# Patient Record
Sex: Male | Born: 1965 | Race: White | Hispanic: No | Marital: Married | Smoking: Never smoker
Health system: Southern US, Community
[De-identification: ages and names within clinical notes are randomized; demographics above are authoritative.]

## PROBLEM LIST (undated history)

## (undated) DIAGNOSIS — K439 Ventral hernia without obstruction or gangrene: Secondary | ICD-10-CM

## (undated) DIAGNOSIS — L570 Actinic keratosis: Secondary | ICD-10-CM

## (undated) DIAGNOSIS — K219 Gastro-esophageal reflux disease without esophagitis: Secondary | ICD-10-CM

## (undated) DIAGNOSIS — D229 Melanocytic nevi, unspecified: Secondary | ICD-10-CM

## (undated) HISTORY — DX: Actinic keratosis: L57.0

## (undated) HISTORY — DX: Melanocytic nevi, unspecified: D22.9

---

## 1998-02-22 ENCOUNTER — Ambulatory Visit (HOSPITAL_COMMUNITY): Admission: RE | Admit: 1998-02-22 | Discharge: 1998-02-22 | Payer: Self-pay | Admitting: Gynecology

## 2009-04-05 ENCOUNTER — Encounter: Admission: RE | Admit: 2009-04-05 | Discharge: 2009-04-05 | Payer: Self-pay | Admitting: Chiropractic Medicine

## 2009-12-27 ENCOUNTER — Ambulatory Visit (HOSPITAL_COMMUNITY)
Admission: RE | Admit: 2009-12-27 | Discharge: 2009-12-27 | Payer: Self-pay | Source: Home / Self Care | Admitting: Orthopaedic Surgery

## 2012-05-20 DIAGNOSIS — H04229 Epiphora due to insufficient drainage, unspecified lacrimal gland: Secondary | ICD-10-CM | POA: Insufficient documentation

## 2012-08-03 ENCOUNTER — Other Ambulatory Visit: Payer: Self-pay | Admitting: Internal Medicine

## 2012-08-03 ENCOUNTER — Ambulatory Visit
Admission: RE | Admit: 2012-08-03 | Discharge: 2012-08-03 | Disposition: A | Discharge status: Home / Self Care | Payer: Commercial Managed Care - PPO | Source: Ambulatory Visit | Attending: Internal Medicine | Admitting: Internal Medicine

## 2012-08-03 DIAGNOSIS — R079 Chest pain, unspecified: Secondary | ICD-10-CM

## 2013-01-04 ENCOUNTER — Encounter: Payer: Self-pay | Admitting: Internal Medicine

## 2014-06-23 ENCOUNTER — Encounter (HOSPITAL_BASED_OUTPATIENT_CLINIC_OR_DEPARTMENT_OTHER): Payer: Self-pay | Admitting: *Deleted

## 2014-06-23 ENCOUNTER — Emergency Department (HOSPITAL_BASED_OUTPATIENT_CLINIC_OR_DEPARTMENT_OTHER)
Admission: EM | Admit: 2014-06-23 | Discharge: 2014-06-23 | Disposition: A | Discharge status: Home / Self Care | Payer: Commercial Managed Care - PPO | Attending: Emergency Medicine | Admitting: Emergency Medicine

## 2014-06-23 DIAGNOSIS — M6281 Muscle weakness (generalized): Secondary | ICD-10-CM | POA: Diagnosis not present

## 2014-06-23 DIAGNOSIS — R509 Fever, unspecified: Secondary | ICD-10-CM | POA: Insufficient documentation

## 2014-06-23 DIAGNOSIS — R112 Nausea with vomiting, unspecified: Secondary | ICD-10-CM | POA: Diagnosis not present

## 2014-06-23 DIAGNOSIS — Z880 Allergy status to penicillin: Secondary | ICD-10-CM | POA: Diagnosis not present

## 2014-06-23 DIAGNOSIS — Z8719 Personal history of other diseases of the digestive system: Secondary | ICD-10-CM | POA: Diagnosis not present

## 2014-06-23 HISTORY — DX: Ventral hernia without obstruction or gangrene: K43.9

## 2014-06-23 HISTORY — DX: Gastro-esophageal reflux disease without esophagitis: K21.9

## 2014-06-23 LAB — CBC WITH DIFFERENTIAL/PLATELET
BASOS ABS: 0 10*3/uL (ref 0.0–0.1)
BASOS PCT: 0 % (ref 0–1)
EOS ABS: 0 10*3/uL (ref 0.0–0.7)
EOS PCT: 0 % (ref 0–5)
HCT: 46.8 % (ref 39.0–52.0)
Hemoglobin: 15.9 g/dL (ref 13.0–17.0)
LYMPHS PCT: 5 % — AB (ref 12–46)
Lymphs Abs: 0.3 10*3/uL — ABNORMAL LOW (ref 0.7–4.0)
MCH: 30.7 pg (ref 26.0–34.0)
MCHC: 34 g/dL (ref 30.0–36.0)
MCV: 90.3 fL (ref 78.0–100.0)
MONO ABS: 0.4 10*3/uL (ref 0.1–1.0)
Monocytes Relative: 6 % (ref 3–12)
NEUTROS PCT: 89 % — AB (ref 43–77)
Neutro Abs: 5.9 10*3/uL (ref 1.7–7.7)
PLATELETS: 143 10*3/uL — AB (ref 150–400)
RBC: 5.18 MIL/uL (ref 4.22–5.81)
RDW: 13.2 % (ref 11.5–15.5)
WBC: 6.6 10*3/uL (ref 4.0–10.5)

## 2014-06-23 LAB — URINALYSIS, ROUTINE W REFLEX MICROSCOPIC
Bilirubin Urine: NEGATIVE
GLUCOSE, UA: NEGATIVE mg/dL
HGB URINE DIPSTICK: NEGATIVE
KETONES UR: 15 mg/dL — AB
LEUKOCYTES UA: NEGATIVE
NITRITE: NEGATIVE
PROTEIN: NEGATIVE mg/dL
SPECIFIC GRAVITY, URINE: 1.035 — AB (ref 1.005–1.030)
Urobilinogen, UA: 0.2 mg/dL (ref 0.0–1.0)
pH: 5.5 (ref 5.0–8.0)

## 2014-06-23 LAB — COMPREHENSIVE METABOLIC PANEL
ALT: 36 U/L (ref 17–63)
ANION GAP: 10 (ref 5–15)
AST: 35 U/L (ref 15–41)
Albumin: 4.3 g/dL (ref 3.5–5.0)
Alkaline Phosphatase: 47 U/L (ref 38–126)
BUN: 18 mg/dL (ref 6–20)
CHLORIDE: 105 mmol/L (ref 101–111)
CO2: 24 mmol/L (ref 22–32)
Calcium: 8.4 mg/dL — ABNORMAL LOW (ref 8.9–10.3)
Creatinine, Ser: 1.05 mg/dL (ref 0.61–1.24)
GFR calc Af Amer: >60 mL/min (ref >60)
GFR calc non Af Amer: >60 mL/min (ref >60)
GLUCOSE: 151 mg/dL — AB (ref 65–99)
Potassium: 3.6 mmol/L (ref 3.5–5.1)
Sodium: 139 mmol/L (ref 135–145)
TOTAL PROTEIN: 7.3 g/dL (ref 6.5–8.1)
Total Bilirubin: 1.2 mg/dL (ref 0.3–1.2)

## 2014-06-23 LAB — LIPASE, BLOOD: Lipase: 25 U/L (ref 22–51)

## 2014-06-23 MED ORDER — ONDANSETRON HCL 4 MG/2ML IJ SOLN
4.0000 mg | Freq: Once | INTRAMUSCULAR | Status: AC
  Administered 2014-06-23: 4 mg via INTRAVENOUS
  Filled 2014-06-23: qty 2

## 2014-06-23 MED ORDER — SODIUM CHLORIDE 0.9 % IV BOLUS (SEPSIS)
1000.0000 mL | Freq: Once | INTRAVENOUS | Status: AC
  Administered 2014-06-23: 1000 mL via INTRAVENOUS

## 2014-06-23 MED ORDER — ACETAMINOPHEN 325 MG PO TABS
975.0000 mg | ORAL_TABLET | Freq: Once | ORAL | Status: AC
  Administered 2014-06-23: 975 mg via ORAL
  Filled 2014-06-23: qty 3

## 2014-06-23 MED ORDER — ONDANSETRON HCL 4 MG PO TABS: 4.0000 mg | ORAL_TABLET | Freq: Four times a day (QID) | ORAL | Status: DC

## 2014-06-23 NOTE — ED Notes (Signed)
Pt here with wife from home, here for NV and low grade fever ("100"), also some weakness, nausea onset 0400 this am, emesis onset 0600, on and off all day, (denies: pain, cough, sob, diarrhea, bleeding, dizziness or other sx),  Recent trip to [place], Dr. Blinda LeatherwoodPollina EDP into room during triage. Last BM today. Emesis worse with PO intake and lying down.

## 2014-06-23 NOTE — Discharge Instructions (Signed)

## 2014-06-23 NOTE — ED Provider Notes (Signed)
CSN: [id number]     Arrival date & time 06/23/14  2026 History  This chart was scribed for Shane Crease, MD by Evon Slack, ED Scribe. This patient was seen in room MH03/MH03 and the patient's care was started at 8:37 PM.     Chief Complaint  Patient presents with  . Emesis   The history is provided by the patient. No language interpreter was used.   HPI Comments: Shane Woods is a 49 y.o. male who presents to the Emergency Department complaining of nausea and  vomiting onset today. Pt states he vomits about 1 hour after drinking. Pt dosnt report any medications PTA. Pt states that he has recently returned home from the [place]. Pt denies diarrhea, abdominal pain. Pt does report HX of GERD.   Past Medical History  Diagnosis Date  . Hernia of abdominal wall   . GERD (gastroesophageal reflux disease)    History reviewed. No pertinent past surgical history. History reviewed. No pertinent family history. History  Substance Use Topics  . Smoking status: Never Smoker   . Smokeless tobacco: Not on file  . Alcohol Use: Yes     Comment: occasional    Review of Systems  Constitutional: Negative for fever.  Gastrointestinal: Positive for nausea and vomiting. Negative for abdominal pain and diarrhea.  All other systems reviewed and are negative.    Allergies  Penicillins  Home Medications   Prior to Admission medications   Medication Sig Start Date End Date Taking? Authorizing Provider  ondansetron (ZOFRAN) 4 MG tablet Take 1 tablet (4 mg total) by mouth every 6 (six) hours. 06/23/14   Shane Crease, MD   BP 124/65 mmHg  Pulse 96  Temp(Src) 102.7 F (39.3 C) (Oral)  Resp 22  Ht  (1.702 m)  Wt 180 lb (81.647 kg)  BMI 28.19 kg/m2  SpO2 96%   Physical Exam  Constitutional: He is oriented to person, place, and time. He appears well-developed and well-nourished. No distress.  HENT:  Head: Normocephalic and atraumatic.  Right Ear: Hearing normal.  Left  Ear: Hearing normal.  Nose: Nose normal.  Mouth/Throat: Oropharynx is clear and moist and mucous membranes are normal.  Eyes: Conjunctivae and EOM are normal. Pupils are equal, round, and reactive to light.  Neck: Normal range of motion. Neck supple.  Cardiovascular: Regular rhythm, S1 normal and S2 normal.  Exam reveals no gallop and no friction rub.   No murmur heard. Pulmonary/Chest: Effort normal and breath sounds normal. No respiratory distress. He exhibits no tenderness.  Abdominal: Soft. Normal appearance and bowel sounds are normal. There is no hepatosplenomegaly. There is no tenderness. There is no rebound, no guarding, no tenderness at McBurney's point and negative Murphy's sign. No hernia.  Musculoskeletal: Normal range of motion.  Neurological: He is alert and oriented to person, place, and time. He has normal strength. No cranial nerve deficit or sensory deficit. Coordination normal. GCS eye subscore is 4. GCS verbal subscore is 5. GCS motor subscore is 6.  Skin: Skin is warm, dry and intact. No rash noted. No cyanosis.  Psychiatric: He has a normal mood and affect. His speech is normal and behavior is normal. Thought content normal.  Nursing note and vitals reviewed.   ED Course  Procedures (including critical care time) DIAGNOSTIC STUDIES: Oxygen Saturation is 96% on ra, nml by my interpretation.    COORDINATION OF CARE: 8:51 PM-Discussed treatment plan with pt at bedside and pt agreed to plan.  Labs Review Labs Reviewed  CBC WITH DIFFERENTIAL/PLATELET - Abnormal; Notable for the following:    Platelets 143 (*)    Neutrophils Relative % 89 (*)    Lymphocytes Relative 5 (*)    Lymphs Abs 0.3 (*)    All other components within normal limits  COMPREHENSIVE METABOLIC PANEL - Abnormal; Notable for the following:    Glucose, Bld 151 (*)    Calcium 8.4 (*)    All other components within normal limits  URINALYSIS, ROUTINE W REFLEX MICROSCOPIC (NOT AT [facility]) - Abnormal;  Notable for the following:    Specific Gravity, Urine 1.035 (*)    Ketones, ur 15 (*)    All other components within normal limits  LIPASE, BLOOD    Imaging Review No results found.   EKG Interpretation None      MDM   Final diagnoses:  Nausea and vomiting, vomiting of unspecified type      Patient presented to the ER for evaluation of generalized weakness with nausea and vomiting. Patient awoke early this morning with nausea and then this morning began having vomiting. He has not been able to hold anything down. He reports a fever at home. He is not, however, expressing any abdominal pain. Abdominal exam is benign. Deep palpation does not elicit any pain or tenderness. Patient's vital signs are unremarkable other than a temperature of 102.7. Blood work is normal including normal white blood cell count. Urinalysis unremarkable. Patient feeling much better after IV fluids and Zofran. Symptoms most likely secondary to viral etiology. Patient will be discharged with symptomatic treatment, return for worsening symptoms.     Shane Creasehristopher J Pollina, MD 06/23/14 2211

## 2014-10-26 IMAGING — RF DG UGI W/ HIGH DENSITY W/KUB
11 of 12 series · 18 of 24 positions shown · non-contrast
Comparison: None.

CLINICAL DATA: Chest pain, possible gastroesophageal reflux

UPPER GI SERIES W/HIGH DENSITY W/KUB
TECHNIQUE: After obtaining a scout radiograph, upper GI series
performed with high density barium and effervescent agent. Thin
barium also used.
Fluoroscopy Time: 1 minute 12 seconds

[Series 1: run · 1 of 6 slices shown (1 of 10)]
[im 1/6]
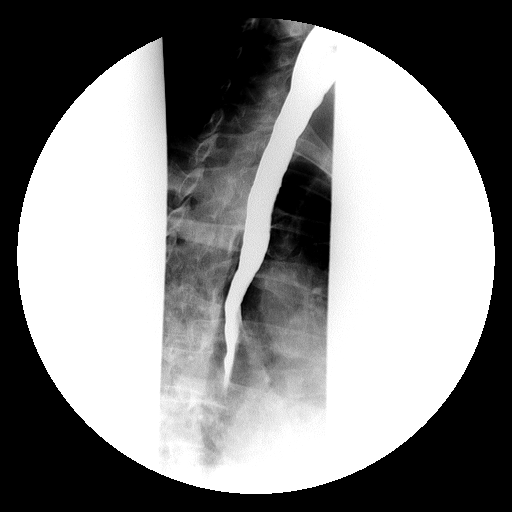

[Series 2: run · 3 of 9 slices shown (2 of 10)]
[im 1/9]
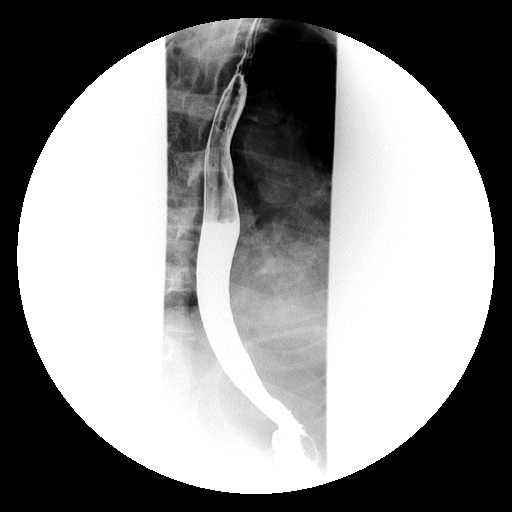
[im 3/9]
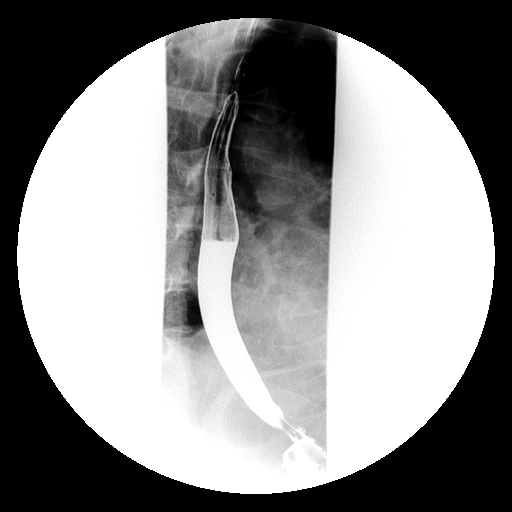
[im 6/9]
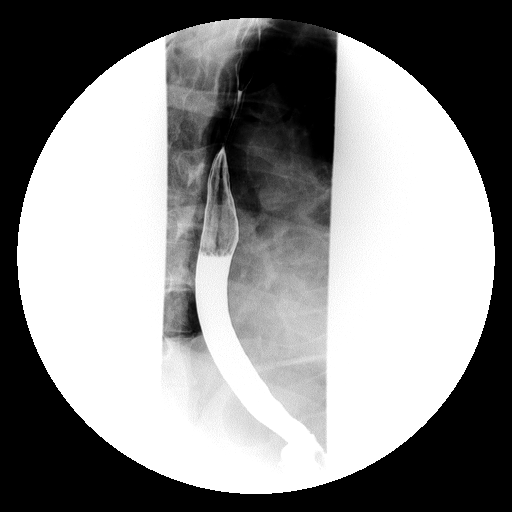

[Series 3: run · 1 of 1 slices shown (3 of 10)]
[im 1/1]
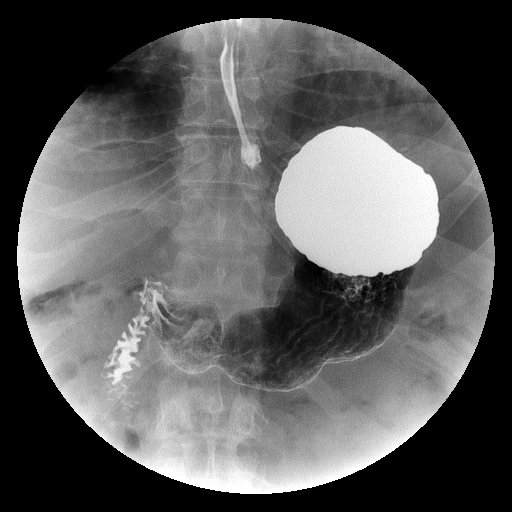

[Series 4: run · 1 of 1 slices shown (4 of 10)]
[im 1/1]
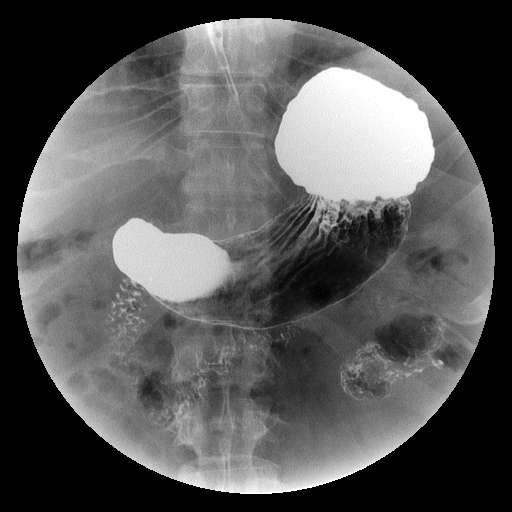

[Series 5: run · 1 of 1 slices shown (5 of 10)]
[im 1/1]
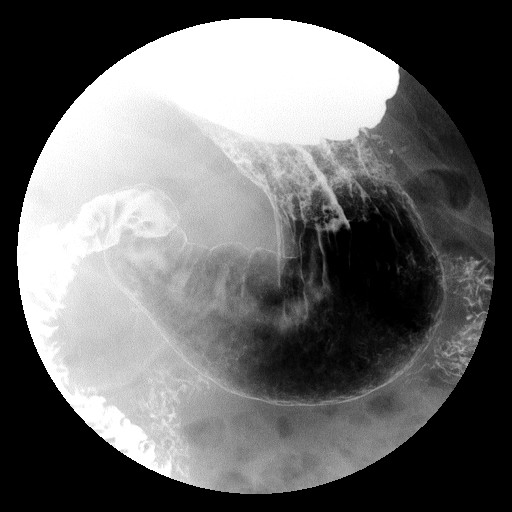

[Series 7: run · 1 of 1 slices shown (6 of 10)]
[im 1/1]
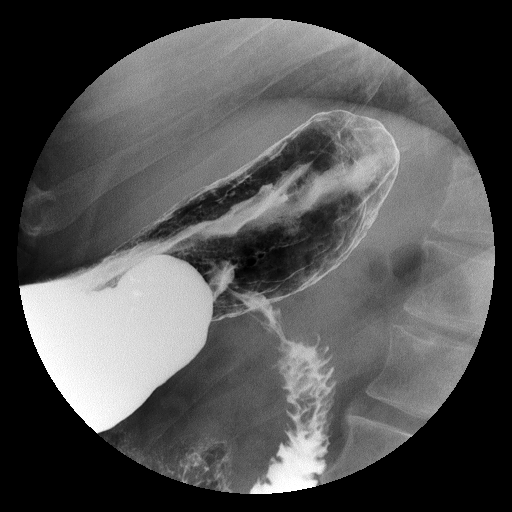

[Series 8: run · 1 of 1 slices shown (7 of 10)]
[im 1/1]
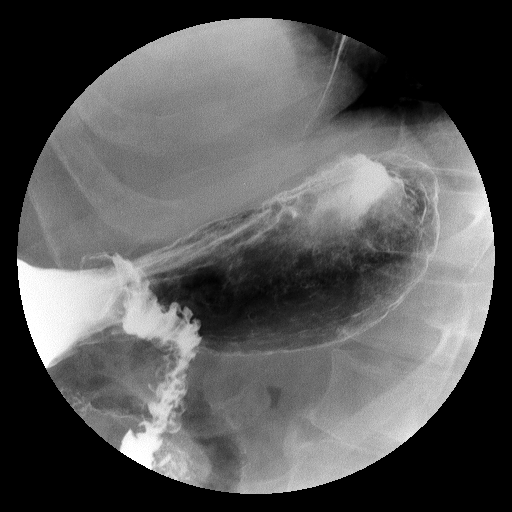

[Series 9: run · 1 of 1 slices shown (8 of 10)]
[im 1/1]
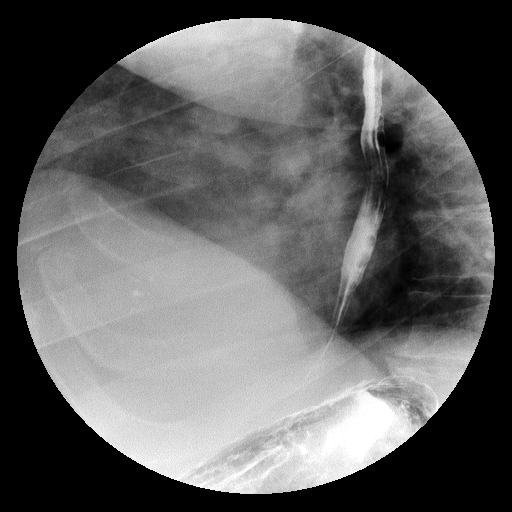

[Series 10: run · 3 of 8 slices shown (9 of 10)]
[im 3/8]
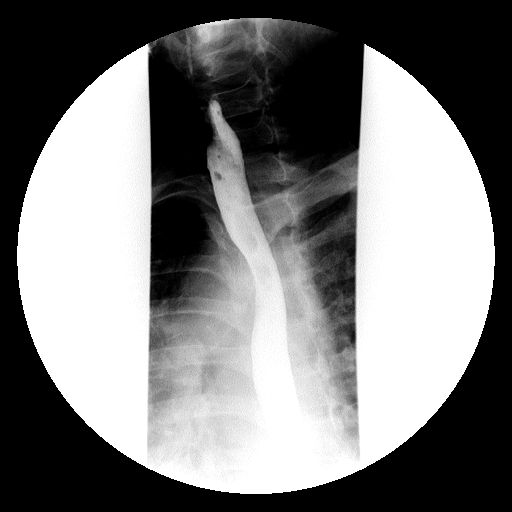
[im 5/8]
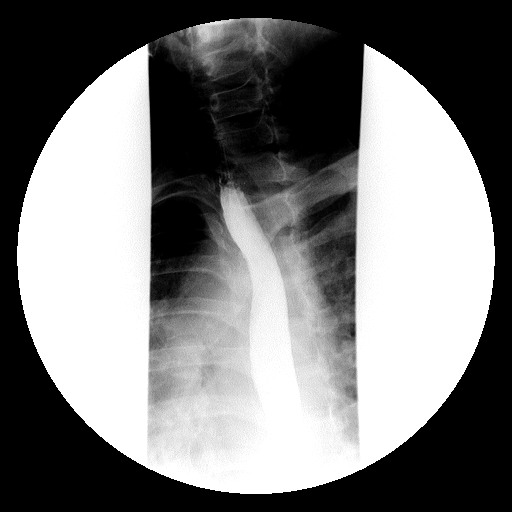
[im 8/8]
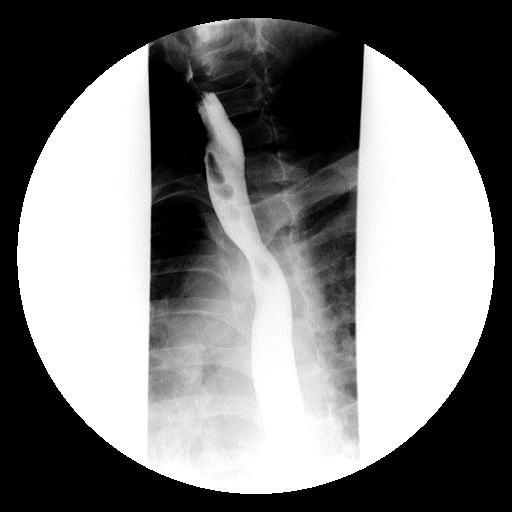

[Series 11: run · 4 of 11 slices shown (10 of 10)]
[im 3/11]
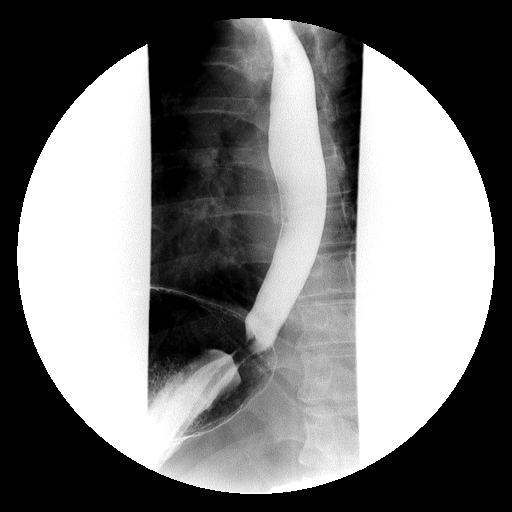
[im 5/11]
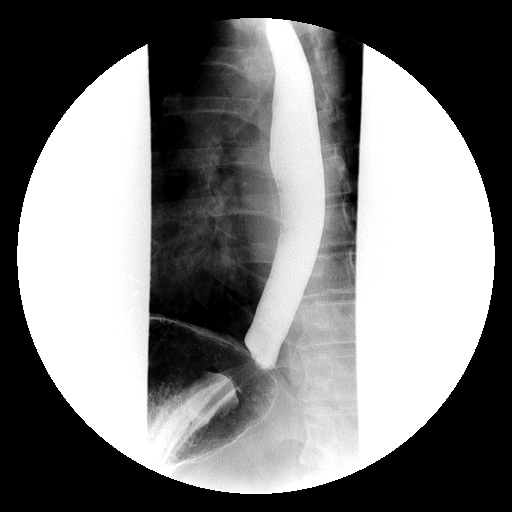
[im 7/11]
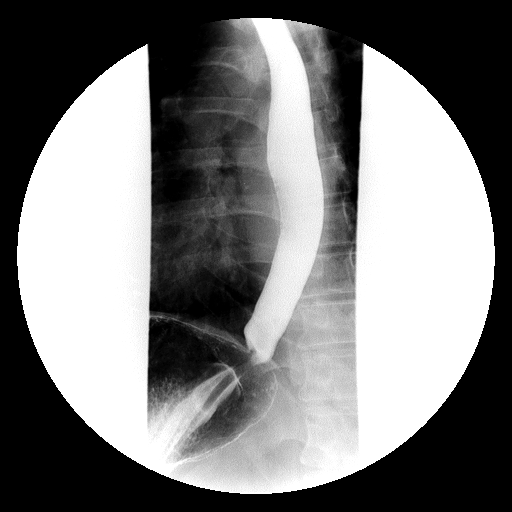
[im 11/11]
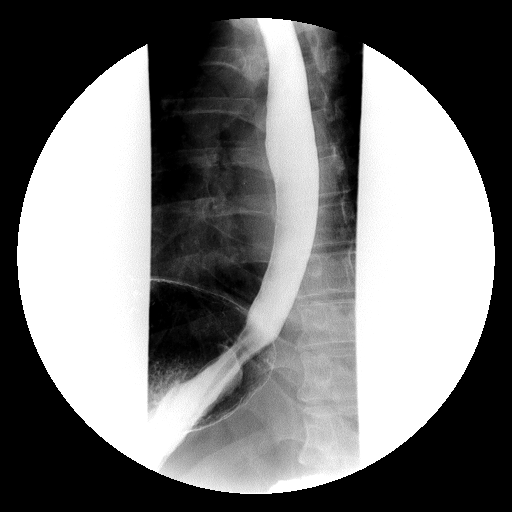

[Series 1001: view not recorded · 0.20mm/px · 1 of 1 slices shown]
[im 1/1]
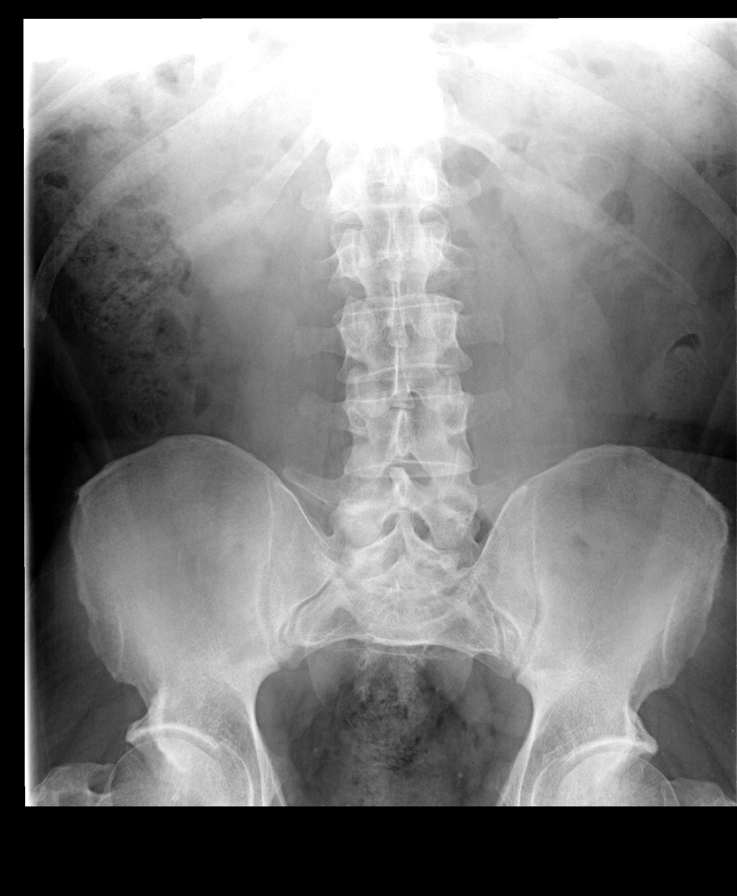

[18 of 24 positions shown; findings below may reference images not displayed]

FINDINGS: Normal esophageal peristalsis.

No fixed esophageal narrowing or stricture.  A 13 mm barium tablet
passed into the stomach without delay.

No hiatal hernia.  Moderate gastroesophageal reflux.

Normal gastric folds and duodenal bulb.
IMPRESSION: Moderate gastroesophageal reflux.

Otherwise negative upper GI.

## 2017-02-22 ENCOUNTER — Other Ambulatory Visit (HOSPITAL_COMMUNITY): Payer: Commercial Managed Care - PPO

## 2017-02-22 ENCOUNTER — Other Ambulatory Visit: Payer: Self-pay | Admitting: Internal Medicine

## 2017-02-22 DIAGNOSIS — R9431 Abnormal electrocardiogram [ECG] [EKG]: Secondary | ICD-10-CM

## 2017-02-22 DIAGNOSIS — R0789 Other chest pain: Secondary | ICD-10-CM

## 2017-02-23 ENCOUNTER — Ambulatory Visit (HOSPITAL_COMMUNITY): Payer: Commercial Managed Care - PPO | Attending: Cardiovascular Disease

## 2017-02-23 ENCOUNTER — Other Ambulatory Visit: Payer: Self-pay

## 2017-02-23 DIAGNOSIS — Z8249 Family history of ischemic heart disease and other diseases of the circulatory system: Secondary | ICD-10-CM | POA: Insufficient documentation

## 2017-02-23 DIAGNOSIS — R0789 Other chest pain: Secondary | ICD-10-CM

## 2017-02-23 DIAGNOSIS — R9431 Abnormal electrocardiogram [ECG] [EKG]: Secondary | ICD-10-CM | POA: Diagnosis not present

## 2017-11-18 DIAGNOSIS — Z23 Encounter for immunization: Secondary | ICD-10-CM | POA: Diagnosis not present

## 2018-02-25 DIAGNOSIS — Z0000 Encounter for general adult medical examination without abnormal findings: Secondary | ICD-10-CM | POA: Diagnosis not present

## 2018-02-25 DIAGNOSIS — E7849 Other hyperlipidemia: Secondary | ICD-10-CM | POA: Diagnosis not present

## 2018-02-25 DIAGNOSIS — Z125 Encounter for screening for malignant neoplasm of prostate: Secondary | ICD-10-CM | POA: Diagnosis not present

## 2018-03-04 DIAGNOSIS — K219 Gastro-esophageal reflux disease without esophagitis: Secondary | ICD-10-CM | POA: Diagnosis not present

## 2018-03-04 DIAGNOSIS — R9431 Abnormal electrocardiogram [ECG] [EKG]: Secondary | ICD-10-CM | POA: Diagnosis not present

## 2018-03-04 DIAGNOSIS — I8390 Asymptomatic varicose veins of unspecified lower extremity: Secondary | ICD-10-CM | POA: Diagnosis not present

## 2018-03-04 DIAGNOSIS — E7849 Other hyperlipidemia: Secondary | ICD-10-CM | POA: Diagnosis not present

## 2018-03-04 DIAGNOSIS — Z1331 Encounter for screening for depression: Secondary | ICD-10-CM | POA: Diagnosis not present

## 2018-03-04 DIAGNOSIS — Z0000 Encounter for general adult medical examination without abnormal findings: Secondary | ICD-10-CM | POA: Diagnosis not present

## 2018-03-09 DIAGNOSIS — Z1212 Encounter for screening for malignant neoplasm of rectum: Secondary | ICD-10-CM | POA: Diagnosis not present

## 2018-03-16 DIAGNOSIS — J029 Acute pharyngitis, unspecified: Secondary | ICD-10-CM | POA: Diagnosis not present

## 2018-03-17 DIAGNOSIS — Z6830 Body mass index (BMI) 30.0-30.9, adult: Secondary | ICD-10-CM | POA: Diagnosis not present

## 2018-03-17 DIAGNOSIS — J029 Acute pharyngitis, unspecified: Secondary | ICD-10-CM | POA: Diagnosis not present

## 2018-03-17 DIAGNOSIS — B029 Zoster without complications: Secondary | ICD-10-CM | POA: Diagnosis not present

## 2018-03-22 DIAGNOSIS — L821 Other seborrheic keratosis: Secondary | ICD-10-CM | POA: Diagnosis not present

## 2018-03-22 DIAGNOSIS — L820 Inflamed seborrheic keratosis: Secondary | ICD-10-CM | POA: Diagnosis not present

## 2018-03-22 DIAGNOSIS — D229 Melanocytic nevi, unspecified: Secondary | ICD-10-CM | POA: Diagnosis not present

## 2018-03-22 DIAGNOSIS — D2271 Melanocytic nevi of right lower limb, including hip: Secondary | ICD-10-CM | POA: Diagnosis not present

## 2018-06-03 DIAGNOSIS — E7849 Other hyperlipidemia: Secondary | ICD-10-CM | POA: Diagnosis not present

## 2018-06-06 DIAGNOSIS — E785 Hyperlipidemia, unspecified: Secondary | ICD-10-CM | POA: Diagnosis not present

## 2018-06-06 DIAGNOSIS — B029 Zoster without complications: Secondary | ICD-10-CM | POA: Diagnosis not present

## 2018-06-06 DIAGNOSIS — J309 Allergic rhinitis, unspecified: Secondary | ICD-10-CM | POA: Diagnosis not present

## 2018-08-11 DIAGNOSIS — D485 Neoplasm of uncertain behavior of skin: Secondary | ICD-10-CM | POA: Diagnosis not present

## 2018-08-11 DIAGNOSIS — L249 Irritant contact dermatitis, unspecified cause: Secondary | ICD-10-CM | POA: Diagnosis not present

## 2018-08-11 DIAGNOSIS — B078 Other viral warts: Secondary | ICD-10-CM | POA: Diagnosis not present

## 2018-08-11 DIAGNOSIS — S70361A Insect bite (nonvenomous), right thigh, initial encounter: Secondary | ICD-10-CM | POA: Diagnosis not present

## 2018-11-24 DIAGNOSIS — Z23 Encounter for immunization: Secondary | ICD-10-CM | POA: Diagnosis not present

## 2019-01-16 DIAGNOSIS — H16141 Punctate keratitis, right eye: Secondary | ICD-10-CM | POA: Diagnosis not present

## 2019-03-02 DIAGNOSIS — Z125 Encounter for screening for malignant neoplasm of prostate: Secondary | ICD-10-CM | POA: Diagnosis not present

## 2019-03-02 DIAGNOSIS — R82998 Other abnormal findings in urine: Secondary | ICD-10-CM | POA: Diagnosis not present

## 2019-03-02 DIAGNOSIS — E7849 Other hyperlipidemia: Secondary | ICD-10-CM | POA: Diagnosis not present

## 2019-03-02 DIAGNOSIS — Z0000 Encounter for general adult medical examination without abnormal findings: Secondary | ICD-10-CM | POA: Diagnosis not present

## 2019-03-09 DIAGNOSIS — B029 Zoster without complications: Secondary | ICD-10-CM | POA: Diagnosis not present

## 2019-03-09 DIAGNOSIS — Z1331 Encounter for screening for depression: Secondary | ICD-10-CM | POA: Diagnosis not present

## 2019-03-09 DIAGNOSIS — E785 Hyperlipidemia, unspecified: Secondary | ICD-10-CM | POA: Diagnosis not present

## 2019-03-09 DIAGNOSIS — R9431 Abnormal electrocardiogram [ECG] [EKG]: Secondary | ICD-10-CM | POA: Diagnosis not present

## 2019-03-09 DIAGNOSIS — Z0000 Encounter for general adult medical examination without abnormal findings: Secondary | ICD-10-CM | POA: Diagnosis not present

## 2019-03-09 DIAGNOSIS — K219 Gastro-esophageal reflux disease without esophagitis: Secondary | ICD-10-CM | POA: Diagnosis not present

## 2019-03-15 DIAGNOSIS — Z1212 Encounter for screening for malignant neoplasm of rectum: Secondary | ICD-10-CM | POA: Diagnosis not present

## 2019-03-31 DIAGNOSIS — H43813 Vitreous degeneration, bilateral: Secondary | ICD-10-CM | POA: Diagnosis not present

## 2019-04-06 DIAGNOSIS — Z86018 Personal history of other benign neoplasm: Secondary | ICD-10-CM | POA: Diagnosis not present

## 2019-04-06 DIAGNOSIS — L570 Actinic keratosis: Secondary | ICD-10-CM | POA: Diagnosis not present

## 2019-04-06 DIAGNOSIS — D2271 Melanocytic nevi of right lower limb, including hip: Secondary | ICD-10-CM | POA: Diagnosis not present

## 2019-04-06 DIAGNOSIS — D1801 Hemangioma of skin and subcutaneous tissue: Secondary | ICD-10-CM | POA: Diagnosis not present

## 2019-04-06 DIAGNOSIS — L814 Other melanin hyperpigmentation: Secondary | ICD-10-CM | POA: Diagnosis not present

## 2019-11-28 DIAGNOSIS — Z23 Encounter for immunization: Secondary | ICD-10-CM | POA: Diagnosis not present

## 2019-12-30 DIAGNOSIS — S139XXA Sprain of joints and ligaments of unspecified parts of neck, initial encounter: Secondary | ICD-10-CM | POA: Diagnosis not present

## 2020-04-08 DIAGNOSIS — L739 Follicular disorder, unspecified: Secondary | ICD-10-CM | POA: Diagnosis not present

## 2020-04-08 DIAGNOSIS — L814 Other melanin hyperpigmentation: Secondary | ICD-10-CM | POA: Diagnosis not present

## 2020-04-08 DIAGNOSIS — L570 Actinic keratosis: Secondary | ICD-10-CM | POA: Diagnosis not present

## 2020-04-08 DIAGNOSIS — Z86018 Personal history of other benign neoplasm: Secondary | ICD-10-CM | POA: Diagnosis not present

## 2020-04-23 DIAGNOSIS — E785 Hyperlipidemia, unspecified: Secondary | ICD-10-CM | POA: Diagnosis not present

## 2020-04-23 DIAGNOSIS — Z125 Encounter for screening for malignant neoplasm of prostate: Secondary | ICD-10-CM | POA: Diagnosis not present

## 2020-04-30 DIAGNOSIS — E785 Hyperlipidemia, unspecified: Secondary | ICD-10-CM | POA: Diagnosis not present

## 2020-04-30 DIAGNOSIS — Z0000 Encounter for general adult medical examination without abnormal findings: Secondary | ICD-10-CM | POA: Diagnosis not present

## 2020-08-21 DIAGNOSIS — R0981 Nasal congestion: Secondary | ICD-10-CM | POA: Diagnosis not present

## 2020-08-21 DIAGNOSIS — J029 Acute pharyngitis, unspecified: Secondary | ICD-10-CM | POA: Diagnosis not present

## 2020-08-21 DIAGNOSIS — R059 Cough, unspecified: Secondary | ICD-10-CM | POA: Diagnosis not present

## 2020-08-21 DIAGNOSIS — U071 COVID-19: Secondary | ICD-10-CM | POA: Diagnosis not present

## 2021-03-27 ENCOUNTER — Other Ambulatory Visit (HOSPITAL_COMMUNITY): Payer: Self-pay

## 2021-03-27 MED ORDER — PEG 3350-KCL-NA BICARB-NACL 420 G PO SOLR
ORAL | 0 refills | Status: AC
  Filled 2021-03-27: qty 4000, 1d supply, fill #0

## 2021-03-28 ENCOUNTER — Other Ambulatory Visit (HOSPITAL_COMMUNITY): Payer: Self-pay

## 2021-03-31 ENCOUNTER — Other Ambulatory Visit (HOSPITAL_COMMUNITY): Payer: Self-pay

## 2021-05-07 ENCOUNTER — Other Ambulatory Visit (HOSPITAL_BASED_OUTPATIENT_CLINIC_OR_DEPARTMENT_OTHER): Payer: Self-pay

## 2021-05-07 MED ORDER — ROSUVASTATIN CALCIUM 5 MG PO TABS
ORAL_TABLET | ORAL | 3 refills | Status: DC
  Filled 2021-05-07 – 2021-05-13 (×2): qty 90, 90d supply, fill #0
  Filled 2021-10-07: qty 90, 90d supply, fill #1

## 2021-05-07 MED ORDER — FLUTICASONE PROPIONATE 50 MCG/ACT NA SUSP
NASAL | 3 refills | Status: AC
  Filled 2021-05-07: qty 16, 30d supply, fill #0
  Filled 2021-05-13: qty 16, 60d supply, fill #0

## 2021-05-12 ENCOUNTER — Other Ambulatory Visit (HOSPITAL_COMMUNITY): Payer: Self-pay

## 2021-05-12 ENCOUNTER — Other Ambulatory Visit: Payer: Self-pay

## 2021-05-13 ENCOUNTER — Other Ambulatory Visit (HOSPITAL_BASED_OUTPATIENT_CLINIC_OR_DEPARTMENT_OTHER): Payer: Self-pay

## 2021-05-13 ENCOUNTER — Other Ambulatory Visit (HOSPITAL_COMMUNITY): Payer: Self-pay

## 2021-06-20 ENCOUNTER — Other Ambulatory Visit (HOSPITAL_COMMUNITY): Payer: Self-pay

## 2021-06-20 MED ORDER — OMRON 3 SERIES BP MONITOR DEVI
0 refills | Status: AC
  Filled 2021-06-20: qty 1, 90d supply, fill #0

## 2021-10-07 ENCOUNTER — Other Ambulatory Visit (HOSPITAL_COMMUNITY): Payer: Self-pay

## 2021-11-03 ENCOUNTER — Other Ambulatory Visit (HOSPITAL_COMMUNITY): Payer: Self-pay

## 2021-11-03 MED ORDER — DOXYCYCLINE HYCLATE 100 MG PO CAPS
100.0000 mg | ORAL_CAPSULE | Freq: Two times a day (BID) | ORAL | 0 refills | Status: DC
  Filled 2021-11-03: qty 20, 10d supply, fill #0

## 2021-12-03 ENCOUNTER — Other Ambulatory Visit: Payer: Self-pay | Admitting: Internal Medicine

## 2021-12-03 DIAGNOSIS — R0789 Other chest pain: Secondary | ICD-10-CM

## 2022-01-12 ENCOUNTER — Ambulatory Visit
Admission: RE | Admit: 2022-01-12 | Discharge: 2022-01-12 | Disposition: A | Discharge status: Home / Self Care | Payer: 59 | Source: Ambulatory Visit | Attending: Internal Medicine | Admitting: Internal Medicine

## 2022-01-12 DIAGNOSIS — R0789 Other chest pain: Secondary | ICD-10-CM

## 2022-02-04 ENCOUNTER — Other Ambulatory Visit (HOSPITAL_COMMUNITY): Payer: Self-pay

## 2022-02-04 ENCOUNTER — Other Ambulatory Visit: Payer: Self-pay

## 2022-02-04 MED ORDER — PAXLOVID (300/100) 20 X 150 MG & 10 X 100MG PO TBPK
3.0000 | ORAL_TABLET | Freq: Two times a day (BID) | ORAL | 0 refills | Status: DC
  Filled 2022-02-04 – 2022-02-05 (×2): qty 30, 5d supply, fill #0

## 2022-02-05 ENCOUNTER — Other Ambulatory Visit (HOSPITAL_COMMUNITY): Payer: Self-pay

## 2022-02-05 MED ORDER — PAXLOVID (300/100) 20 X 150 MG & 10 X 100MG PO TBPK
ORAL_TABLET | ORAL | 0 refills | Status: DC
  Filled 2022-02-05: qty 30, 5d supply, fill #0

## 2022-02-16 NOTE — Progress Notes (Signed)
Cardiology Office Note:    Date:  02/23/2022   ID:  Shane Woods, DOB Apr 11, 1965, MRN [id number]  PCP:  Prince Solian, MD   [place] Providers Cardiologist:  None   Referring MD: Prince Solian, MD    History of Present Illness:    Shane Woods is a 57 y.o. male with a hx of GERD who was referred by Dr. Dagmar Hait for further evaluation of coronary calcification.   Patient underwent Ca score on 12/18 which revealed Ca score 390 prompting referral to Cardiology for further evaluation.  Today, the patient overall feels well. No chest pain, SOB, lightheadedness, dizziness, LE edema, orthopnea, and PND. No tobacco use or alcohol. Discussed his Ca score at length today.   Father with history of CAD with history of MI.   Past Medical History:  Diagnosis Date   GERD (gastroesophageal reflux disease)    Hernia of abdominal wall     History reviewed. No pertinent surgical history.  Current Medications: Current Meds  Medication Sig   Blood Pressure Monitoring (OMRON 3 SERIES BP MONITOR) DEVI Use as directed   doxycycline (VIBRAMYCIN) 100 MG capsule Take 1 capsule (100 mg total) by mouth 2 (two) times daily as directed   fluticasone (FLONASE ALLERGY RELIEF) 50 MCG/ACT nasal spray Place 1 spray in each nostril Once a day   polyethylene glycol-electrolytes (NULYTELY) 420 g solution Use as directed.   rosuvastatin (CRESTOR) 5 MG tablet Take 1 tablet by mouth daily     Allergies:   Penicillins   Social History   Socioeconomic History   Marital status: Married    Spouse name: Not on file   Number of children: Not on file   Years of education: Not on file   Highest education level: Not on file  Occupational History   Not on file  Tobacco Use   Smoking status: Never   Smokeless tobacco: Never  Substance and Sexual Activity   Alcohol use: Never    Comment: occasional   Drug use: No   Sexual activity: Not on file  Other Topics Concern   Not on file  Social  History Narrative   Not on file   Social Determinants of Health   Financial Resource Strain: Not on file  Food Insecurity: Not on file  Transportation Needs: Not on file  Physical Activity: Not on file  Stress: Not on file  Social Connections: Not on file     Family History: The patient's family history includes Heart attack in his father.  ROS:   Please see the history of present illness.     All other systems reviewed and are negative.  EKGs/Labs/Other Studies Reviewed:    The following studies were reviewed today: Ca Score 12/2021:  IMPRESSION: 1. LAD and RIGHT coronary artery calcification.   2. Total Agatston Score: 390   3. MESA age, race  and sex matched database percentile: 36th   4. Two pulmonary nodules. Most significant: 4 mm left solid pulmonary nodule. Per Fleischner Society Guidelines, no routine follow-up imaging is recommended.  TTE 01/2017: Study Conclusions   - Left ventricle: The cavity size was normal. Systolic function was    normal. The estimated ejection fraction was in the range of 55%    to 60%. Wall motion was normal; there were no regional wall    motion abnormalities. Left ventricular diastolic function    parameters were normal.  - Left atrium: The atrium was moderately dilated.  - Atrial septum: No  defect or patent foramen ovale was identified.   EKG:  EKG is  ordered today.  The ekg ordered today demonstrates NSR, LVH with strain, HR 69  Recent Labs: No results found for requested labs within last 365 days.  Recent Lipid Panel No results found for: "CHOL", "TRIG", "HDL", "CHOLHDL", "VLDL", "LDLCALC", "LDLDIRECT"   Risk Assessment/Calculations:                Physical Exam:    VS:  BP 138/88   Pulse 68   Ht 5\' 7"  (1.702 m)   Wt 195 lb 3.2 oz (88.5 kg)   SpO2 97%   BMI 30.57 kg/m     Wt Readings from Last 3 Encounters:  02/23/22 195 lb 3.2 oz (88.5 kg)  06/23/14 180 lb (81.6 kg)     GEN:  Well nourished, well  developed in no acute distress HEENT: Normal NECK: No JVD; No carotid bruits CARDIAC: RRR, no murmurs, rubs, gallops RESPIRATORY:  Clear to auscultation without rales, wheezing or rhonchi  ABDOMEN: Soft, non-tender, non-distended MUSCULOSKELETAL:  No edema; No deformity  SKIN: Warm and dry NEUROLOGIC:  Alert and oriented x 3 PSYCHIATRIC:  Normal affect   ASSESSMENT:    1. Coronary artery disease involving native coronary artery of native heart without angina pectoris   2. Medication management   3. Hyperlipidemia, unspecified hyperlipidemia type   4. LVH (left ventricular hypertrophy)    PLAN:    In order of problems listed above:  #Coronary Ca: Ca score 390 which was 93% for age, gender, race matched controls. Currently doing well without anginal symptoms. Will risk stratify with NMR, Lp(a), apolipoprotein B given his degree of calcification is out of proportion to his LDL cholesterol.  -Continue crestor 5mg  daily -Check NMR, Lp(a), apolipoprotein B  #Abnormal ECG: ECG suggestive of LVH with strain. BP 130s today and he will start monitoring at home. Will check TTE to assess further. -Check TTE          Medication Adjustments/Labs and Tests Ordered: Current medicines are reviewed at length with the patient today.  Concerns regarding medicines are outlined above.  Orders Placed This Encounter  Procedures   NMR, lipoprofile   Lipoprotein A (LPA)   Apolipoprotein B   EKG 12-Lead   ECHOCARDIOGRAM COMPLETE   No orders of the defined types were placed in this encounter.   Patient Instructions  Medication Instructions:   Your physician recommends that you continue on your current medications as directed. Please refer to the Current Medication list given to you today.  *If you need a refill on your cardiac medications before your next appointment, please call your pharmacy*   Lab Work:  TODAY--NMR LIPOPROFILE, LIPOPROTEIN A, AND APOLIPOPROTEIN B  If you have labs  (blood work) drawn today and your tests are completely normal, you will receive your results only by: MyChart Message (if you have MyChart) OR A paper copy in the mail If you have any lab test that is abnormal or we need to change your treatment, we will call you to review the results.   Testing/Procedures:  Your physician has requested that you have an echocardiogram. Echocardiography is a painless test that uses sound waves to create images of your heart. It provides your doctor with information about the size and shape of your heart and how well your heart's chambers and valves are working. This procedure takes approximately one hour. There are no restrictions for this procedure. Please do NOT wear cologne, perfume, aftershave,  or lotions (deodorant is allowed). Please arrive 15 minutes prior to your appointment time.    Follow-Up: At [facility], you and your health needs are our priority.  As part of our continuing mission to provide you with exceptional heart care, we have created designated Provider Care Teams.  These Care Teams include your primary Cardiologist (physician) and Advanced Practice Providers (APPs -  Physician Assistants and Nurse Practitioners) who all work together to provide you with the care you need, when you need it.  We recommend signing up for the patient portal called "MyChart".  Sign up information is provided on this After Visit Summary.  MyChart is used to connect with patients for Virtual Visits (Telemedicine).  Patients are able to view lab/test results, encounter notes, upcoming appointments, etc.  Non-urgent messages can be sent to your provider as well.   To learn more about what you can do with MyChart, go to [email or web address].    Your next appointment:   6 month(s)  Provider:   Nicholes Rough, PA-C, Melina Copa, PA-C, Ambrose Pancoast, NP, Cecilie Kicks, NP, Ermalinda Barrios, PA-C, Christen Bame, NP, or Richardson Dopp, PA-C              Signed, Freada Bergeron, MD  02/23/2022 4:45 PM    [place]

## 2022-02-23 ENCOUNTER — Ambulatory Visit: Payer: Commercial Managed Care - PPO | Attending: Cardiology | Admitting: Cardiology

## 2022-02-23 ENCOUNTER — Encounter: Payer: Self-pay | Admitting: Cardiology

## 2022-02-23 VITALS — BP 138/88 | HR 68 | Ht 67.0 in | Wt 195.2 lb

## 2022-02-23 DIAGNOSIS — Z79899 Other long term (current) drug therapy: Secondary | ICD-10-CM | POA: Diagnosis not present

## 2022-02-23 DIAGNOSIS — I517 Cardiomegaly: Secondary | ICD-10-CM

## 2022-02-23 DIAGNOSIS — I2510 Atherosclerotic heart disease of native coronary artery without angina pectoris: Secondary | ICD-10-CM

## 2022-02-23 DIAGNOSIS — E785 Hyperlipidemia, unspecified: Secondary | ICD-10-CM

## 2022-02-23 NOTE — Patient Instructions (Signed)
Medication Instructions:   Your physician recommends that you continue on your current medications as directed. Please refer to the Current Medication list given to you today.  *If you need a refill on your cardiac medications before your next appointment, please call your pharmacy*   Lab Work:  TODAY--NMR LIPOPROFILE, LIPOPROTEIN A, AND APOLIPOPROTEIN B  If you have labs (blood work) drawn today and your tests are completely normal, you will receive your results only by: [place] (if you have MyChart) OR A paper copy in the mail If you have any lab test that is abnormal or we need to change your treatment, we will call you to review the results.   Testing/Procedures:  Your physician has requested that you have an echocardiogram. Echocardiography is a painless test that uses sound waves to create images of your heart. It provides your doctor with information about the size and shape of your heart and how well your heart's chambers and valves are working. This procedure takes approximately one hour. There are no restrictions for this procedure. Please do NOT wear cologne, perfume, aftershave, or lotions (deodorant is allowed). Please arrive 15 minutes prior to your appointment time.    Follow-Up: At [facility], you and your health needs are our priority.  As part of our continuing mission to provide you with exceptional heart care, we have created designated Provider Care Teams.  These Care Teams include your primary Cardiologist (physician) and Advanced Practice Providers (APPs -  Physician Assistants and Nurse Practitioners) who all work together to provide you with the care you need, when you need it.  We recommend signing up for the patient portal called "MyChart".  Sign up information is provided on this After Visit Summary.  MyChart is used to connect with patients for Virtual Visits (Telemedicine).  Patients are able to view lab/test results, encounter notes,  upcoming appointments, etc.  Non-urgent messages can be sent to your provider as well.   To learn more about what you can do with MyChart, go to [email or web address].    Your next appointment:   6 month(s)  Provider:   Nicholes Rough, PA-C, Melina Copa, PA-C, Ambrose Pancoast, NP, Cecilie Kicks, NP, Ermalinda Barrios, PA-C, Christen Bame, NP, or Richardson Dopp, PA-C

## 2022-02-24 LAB — LIPOPROTEIN A (LPA): Lipoprotein (a): 12.2 nmol/L (ref <75.0)

## 2022-02-24 LAB — NMR, LIPOPROFILE
Cholesterol, Total: 175 mg/dL (ref 100–199)
HDL Particle Number: 31.4 umol/L (ref >=30.5)
HDL-C: 35 mg/dL — ABNORMAL LOW (ref >39)
LDL Particle Number: 1389 nmol/L — ABNORMAL HIGH (ref <1000)
LDL Size: 20.1 nm — ABNORMAL LOW (ref >20.5)
LDL-C (NIH Calc): 77 mg/dL (ref 0–99)
LP-IR Score: 70 — ABNORMAL HIGH (ref <=45)
Small LDL Particle Number: 878 nmol/L — ABNORMAL HIGH (ref <=527)
Triglycerides: 390 mg/dL — ABNORMAL HIGH (ref 0–149)

## 2022-02-24 LAB — APOLIPOPROTEIN B: Apolipoprotein B: 87 mg/dL (ref <90)

## 2022-02-25 ENCOUNTER — Encounter: Payer: Self-pay | Admitting: Cardiology

## 2022-02-25 DIAGNOSIS — E785 Hyperlipidemia, unspecified: Secondary | ICD-10-CM

## 2022-02-25 DIAGNOSIS — Z79899 Other long term (current) drug therapy: Secondary | ICD-10-CM

## 2022-02-25 DIAGNOSIS — I2510 Atherosclerotic heart disease of native coronary artery without angina pectoris: Secondary | ICD-10-CM

## 2022-02-26 ENCOUNTER — Encounter: Payer: Self-pay | Admitting: Cardiology

## 2022-02-26 ENCOUNTER — Other Ambulatory Visit (HOSPITAL_COMMUNITY): Payer: Self-pay

## 2022-02-26 MED ORDER — ROSUVASTATIN CALCIUM 10 MG PO TABS
10.0000 mg | ORAL_TABLET | Freq: Every day | ORAL | 1 refills | Status: AC
  Filled 2022-02-26: qty 90, 90d supply, fill #0
  Filled 2022-09-06: qty 90, 90d supply, fill #1

## 2022-02-26 NOTE — Telephone Encounter (Signed)
Spoke with the pt and he would like his increased dose of crestor 10 mg po daily sent to [facility] and his lab appt in 8 weeks to recheck lipids on 04/24/22.    Pt is aware to come fasting to this lab appt.   Pt verbalized understanding and agrees with this plan.

## 2022-02-26 NOTE — Telephone Encounter (Signed)
Shane Bergeron, Shane Woods  [name], LPN His cholesterol shows that his LDL levels are slightly above goal (goal LDL<70). His particle number is borderline-high and his LDL particle size is small which means it is more likely to stick to his arteries (which we know as his Ca score is elevated). Let's increase his crestor to 10mg  daily and repeat cholesterol in 8 weeks to ensure we are at goal.   Pt made aware of lab results and recommendations via this mychart message by Dr. Johney Frame.   Awaiting for the pt to message me back with his pharmacy preference and when he would like for me to schedule his 8 week lipid lab appt for.  He is aware to come fasting to the lab appt.

## 2022-03-17 ENCOUNTER — Other Ambulatory Visit (HOSPITAL_COMMUNITY): Payer: Commercial Managed Care - PPO

## 2022-03-27 ENCOUNTER — Ambulatory Visit (HOSPITAL_COMMUNITY): Payer: Commercial Managed Care - PPO | Attending: Cardiology

## 2022-03-27 DIAGNOSIS — I517 Cardiomegaly: Secondary | ICD-10-CM | POA: Insufficient documentation

## 2022-03-27 LAB — ECHOCARDIOGRAM COMPLETE
Area-P 1/2: 4.32 cm2
S' Lateral: 2.7 cm

## 2022-04-24 ENCOUNTER — Ambulatory Visit: Payer: Commercial Managed Care - PPO | Attending: Cardiology

## 2022-04-24 DIAGNOSIS — Z79899 Other long term (current) drug therapy: Secondary | ICD-10-CM

## 2022-04-24 DIAGNOSIS — I2510 Atherosclerotic heart disease of native coronary artery without angina pectoris: Secondary | ICD-10-CM

## 2022-04-24 DIAGNOSIS — E785 Hyperlipidemia, unspecified: Secondary | ICD-10-CM

## 2022-04-25 LAB — LIPID PANEL
Chol/HDL Ratio: 2.7 ratio (ref 0.0–5.0)
Cholesterol, Total: 122 mg/dL (ref 100–199)
HDL: 46 mg/dL (ref >39)
LDL Chol Calc (NIH): 57 mg/dL (ref 0–99)
Triglycerides: 105 mg/dL (ref 0–149)
VLDL Cholesterol Cal: 19 mg/dL (ref 5–40)

## 2022-05-11 ENCOUNTER — Encounter: Payer: Self-pay | Admitting: Dermatology

## 2022-05-11 ENCOUNTER — Ambulatory Visit: Payer: Commercial Managed Care - PPO | Admitting: Dermatology

## 2022-05-11 ENCOUNTER — Other Ambulatory Visit (HOSPITAL_COMMUNITY): Payer: Self-pay

## 2022-05-11 VITALS — BP 138/89

## 2022-05-11 DIAGNOSIS — L570 Actinic keratosis: Secondary | ICD-10-CM

## 2022-05-11 DIAGNOSIS — W908XXA Exposure to other nonionizing radiation, initial encounter: Secondary | ICD-10-CM | POA: Diagnosis not present

## 2022-05-11 DIAGNOSIS — L739 Follicular disorder, unspecified: Secondary | ICD-10-CM

## 2022-05-11 DIAGNOSIS — X32XXXA Exposure to sunlight, initial encounter: Secondary | ICD-10-CM

## 2022-05-11 DIAGNOSIS — L821 Other seborrheic keratosis: Secondary | ICD-10-CM

## 2022-05-11 DIAGNOSIS — Z1283 Encounter for screening for malignant neoplasm of skin: Secondary | ICD-10-CM

## 2022-05-11 DIAGNOSIS — D225 Melanocytic nevi of trunk: Secondary | ICD-10-CM

## 2022-05-11 DIAGNOSIS — D1801 Hemangioma of skin and subcutaneous tissue: Secondary | ICD-10-CM

## 2022-05-11 DIAGNOSIS — L814 Other melanin hyperpigmentation: Secondary | ICD-10-CM | POA: Diagnosis not present

## 2022-05-11 DIAGNOSIS — L304 Erythema intertrigo: Secondary | ICD-10-CM

## 2022-05-11 DIAGNOSIS — L578 Other skin changes due to chronic exposure to nonionizing radiation: Secondary | ICD-10-CM | POA: Diagnosis not present

## 2022-05-11 MED ORDER — CLINDAMYCIN PHOSPHATE 1 % EX GEL
Freq: Two times a day (BID) | CUTANEOUS | 0 refills | Status: AC
  Filled 2022-05-11: qty 30, 30d supply, fill #0

## 2022-05-11 MED ORDER — NYSTATIN-TRIAMCINOLONE 100000-0.1 UNIT/GM-% EX OINT
1.0000 | TOPICAL_OINTMENT | Freq: Two times a day (BID) | CUTANEOUS | 0 refills | Status: AC
  Filled 2022-05-11: qty 30, 15d supply, fill #0

## 2022-05-11 NOTE — Progress Notes (Unsigned)
New Patient Visit   Subjective  Shane Woods is a 57 y.o. male who presents for the following: Skin Cancer Screening and Full Body Skin Exam. He has no personal history of ski cancer. He has a history a Atypical Nevus on the left medial lower leg and Actinic Keratoses on the arms treated with LN2.   The patient presents for Total-Body Skin Exam (TBSE) for skin cancer screening and mole check. The patient has spots, moles and lesions to be evaluated, some may be new or changing and the patient has concerns that these could be cancer.    The following portions of the chart were reviewed this encounter and updated as appropriate: medications, allergies, medical history  Review of Systems:  No other skin or systemic complaints except as noted in HPI or Assessment and Plan.  Objective  Well appearing patient in no apparent distress; mood and affect are within normal limits.  A full examination was performed including scalp, head, eyes, ears, nose, lips, neck, chest, axillae, abdomen, back, buttocks, bilateral upper extremities, bilateral lower extremities, hands, feet, fingers, toes, fingernails, and toenails. All findings within normal limits unless otherwise noted below.   Relevant physical exam findings are noted in the Assessment and Plan. A dermatoscope was used during the exam.  The following people were also present during my examination: , my medical assistant (male)    Assessment & Plan   LENTIGINES, SEBORRHEIC KERATOSES, HEMANGIOMAS - Benign normal skin lesions - Benign-appearing - Call for any changes  MELANOCYTIC NEVI - Tan-brown and/or pink-flesh-colored symmetric macules and papules - Benign appearing on exam today - Observation - Call clinic for new or changing moles - Recommend daily use of broad spectrum spf 30+ sunscreen to sun-exposed areas.   ACTINIC DAMAGE - Chronic condition, secondary to cumulative UV/sun exposure - diffuse scaly erythematous macules  with underlying dyspigmentation - Recommend daily broad spectrum sunscreen SPF 30+ to sun-exposed areas, reapply every 2 hours as needed.  - Staying in the shade or wearing long sleeves, sun glasses (UVA+UVB protection) and wide brim hats (4-inch brim around the entire circumference of the hat) are also recommended for sun protection.  - Call for new or changing lesions.ACTINIC KERATOSIS Exam: Erythematous thin papules/macules with gritty scale  Actinic keratoses are precancerous spots that appear secondary to cumulative UV radiation exposure/sun exposure over time. They are chronic with expected duration over 1 year. A portion of actinic keratoses will progress to squamous cell carcinoma of the skin. It is not possible to reliably predict which spots will progress to skin cancer and so treatment is recommended to prevent development of skin cancer.  Recommend daily broad spectrum sunscreen SPF 30+ to sun-exposed areas, reapply every 2 hours as needed.  Recommend staying in the shade or wearing long sleeves, sun glasses (UVA+UVB protection) and wide brim hats (4-inch brim around the entire circumference of the hat). Call for new or changing lesions.  Treatment Plan:  Prior to procedure, discussed risks of blister formation, small wound, skin dyspigmentation, or rare scar following cryotherapy. Recommend Vaseline ointment to treated areas while healing.  Destruction Procedure Note Destruction method: cryotherapy   Informed consent: discussed and consent obtained   Lesion destroyed using liquid nitrogen: Yes   Outcome: patient tolerated procedure well with no complications   Post-procedure details: wound care instructions given   Locations: right lateral ankle # of Lesions Treated: 1   INTERTRIGO Exam Erythematous macerated patches  Wellcontrolled   Intertrigo is a chronic recurrent rash  that occurs in skin fold areas that may be associated with friction; heat; moisture; yeast; fungus;  and bacteria.  It is exacerbated by increased movement / activity; sweating; and higher atmospheric temperature.  Treatment Plan Nystatin/Triamcinolone cream BID to affected areas when needed  FOLLICULITIS Exam: Perifollicular erythematous papules and pustules  Treatment Plan: Clindamycin 1% gel BID to affected areas when needed    SKIN CANCER SCREENING PERFORMED TODAY.      No follow-ups on file.  Jaclynn Guarneri, CMA, am acting as scribe for Langston Reusing, MD.   Documentation: I have reviewed the above documentation for accuracy and completeness, and I agree with the above.  Langston Reusing, MD

## 2022-05-11 NOTE — Patient Instructions (Signed)
Cryotherapy Aftercare  Wash gently with soap and water everyday.   Apply Vaseline and Band-Aid daily until healed. Cryotherapy Aftercare  Wash gently with soap and water everyday.   Apply Vaseline and Band-Aid daily until healed.   Due to recent changes in healthcare laws, you may see results of your pathology and/or laboratory studies on MyChart before the doctors have had a chance to review them. We understand that in some cases there may be results that are confusing or concerning to you. Please understand that not all results are received at the same time and often the doctors may need to interpret multiple results in order to provide you with the best plan of care or course of treatment. Therefore, we ask that you please give [place] 2 business days to thoroughly review all your results before contacting the office for clarification. Should we see a critical lab result, you will be contacted sooner.   If You Need Anything After Your Visit  If you have any questions or concerns for your doctor, please call our main line at [phone] If no one answers, please leave a voicemail as directed and we will return your call as soon as possible. Messages left after 4 pm will be answered the following business day.   You may also send [place] a message via MyChart. We typically respond to MyChart messages within 1-2 business days.  For prescription refills, please ask your pharmacy to contact our office. Our fax number is [phone].  If you have an urgent issue when the clinic is closed that cannot wait until the next business day, you can page your doctor at the number below.    Please note that while we do our best to be available for urgent issues outside of office hours, we are not available 24/7.   If you have an urgent issue and are unable to reach [place], you may choose to seek medical care at your doctor's office, retail clinic, urgent care center, or emergency room.  If you have a medical  emergency, please immediately call 911 or go to the emergency department. In the event of inclement weather, please call our main line at [phone] for an update on the status of any delays or closures.  Dermatology Medication Tips: Please keep the boxes that topical medications come in in order to help keep track of the instructions about where and how to use these. Pharmacies typically print the medication instructions only on the boxes and not directly on the medication tubes.   If your medication is too expensive, please contact our office at [phone] or send [place] a message through MyChart.   We are unable to tell what your co-pay for medications will be in advance as this is different depending on your insurance coverage. However, we may be able to find a substitute medication at lower cost or fill out paperwork to get insurance to cover a needed medication.   If a prior authorization is required to get your medication covered by your insurance company, please allow [place] 1-2 business days to complete this process.  Drug prices often vary depending on where the prescription is filled and some pharmacies may offer cheaper prices.  The website www.goodrx.com contains coupons for medications through different pharmacies. The prices here do not account for what the cost may be with help from insurance (it may be cheaper with your insurance), but the website can give you the price if you did not use any insurance.  - You  can print the associated coupon and take it with your prescription to the pharmacy.  - You may also stop by our office during regular business hours and pick up a GoodRx coupon card.  - If you need your prescription sent electronically to a different pharmacy, notify our office through [facility] or by phone at [phone]    Due to recent changes in healthcare laws, you may see results of your pathology and/or laboratory studies on MyChart before the doctors have had  a chance to review them. We understand that in some cases there may be results that are confusing or concerning to you. Please understand that not all results are received at the same time and often the doctors may need to interpret multiple results in order to provide you with the best plan of care or course of treatment. Therefore, we ask that you please give [place] 2 business days to thoroughly review all your results before contacting the office for clarification. Should we see a critical lab result, you will be contacted sooner.   If You Need Anything After Your Visit  If you have any questions or concerns for your doctor, please call our main line at [phone] If no one answers, please leave a voicemail as directed and we will return your call as soon as possible. Messages left after 4 pm will be answered the following business day.   You may also send [place] a message via MyChart. We typically respond to MyChart messages within 1-2 business days.  For prescription refills, please ask your pharmacy to contact our office. Our fax number is [phone].  If you have an urgent issue when the clinic is closed that cannot wait until the next business day, you can page your doctor at the number below.    Please note that while we do our best to be available for urgent issues outside of office hours, we are not available 24/7.   If you have an urgent issue and are unable to reach [place], you may choose to seek medical care at your doctor's office, retail clinic, urgent care center, or emergency room.  If you have a medical emergency, please immediately call 911 or go to the emergency department. In the event of inclement weather, please call our main line at [phone] for an update on the status of any delays or closures.  Dermatology Medication Tips: Please keep the boxes that topical medications come in in order to help keep track of the instructions about where and how to use these. Pharmacies  typically print the medication instructions only on the boxes and not directly on the medication tubes.   If your medication is too expensive, please contact our office at [phone] or send [place] a message through MyChart.   We are unable to tell what your co-pay for medications will be in advance as this is different depending on your insurance coverage. However, we may be able to find a substitute medication at lower cost or fill out paperwork to get insurance to cover a needed medication.   If a prior authorization is required to get your medication covered by your insurance company, please allow [place] 1-2 business days to complete this process.  Drug prices often vary depending on where the prescription is filled and some pharmacies may offer cheaper prices.  The website www.goodrx.com contains coupons for medications through different pharmacies. The prices here do not account for what the cost may be with help from insurance (it may  be cheaper with your insurance), but the website can give you the price if you did not use any insurance.  - You can print the associated coupon and take it with your prescription to the pharmacy.  - You may also stop by our office during regular business hours and pick up a GoodRx coupon card.  - If you need your prescription sent electronically to a different pharmacy, notify our office through [facility] or by phone at [phone]    Skin Education :   I counseled the patient regarding the following: Sun screen (SPF 30 or greater) should be applied during peak UV exposure (between 10am and 2pm) and reapplied after exercise or swimming.  The ABCDEs of melanoma were reviewed with the patient, and the importance of monthly self-examination of moles was emphasized. Should any moles change in shape or color, or itch, bleed or burn, pt will contact our office for evaluation sooner then their interval appointment.  Plan: Sunscreen Recommendations I  recommended a broad spectrum sunscreen with a SPF of 30 or higher. I explained that SPF 30 sunscreens block approximately 97 percent of the sun's harmful rays. Sunscreens should be applied at least 15 minutes prior to expected sun exposure and then every 2 hours after that as long as sun exposure continues. If swimming or exercising sunscreen should be reapplied every 45 minutes to an hour after getting wet or sweating. One ounce, or the equivalent of a shot glass full of sunscreen, is adequate to protect the skin not covered by a bathing suit. I also recommended a lip balm with a sunscreen as well. Sun protective clothing can be used in lieu of sunscreen but must be worn the entire time you are exposed to the sun's rays.

## 2022-05-18 DIAGNOSIS — E785 Hyperlipidemia, unspecified: Secondary | ICD-10-CM | POA: Diagnosis not present

## 2022-05-18 DIAGNOSIS — Z125 Encounter for screening for malignant neoplasm of prostate: Secondary | ICD-10-CM | POA: Diagnosis not present

## 2022-05-18 DIAGNOSIS — K219 Gastro-esophageal reflux disease without esophagitis: Secondary | ICD-10-CM | POA: Diagnosis not present

## 2022-05-25 DIAGNOSIS — R82998 Other abnormal findings in urine: Secondary | ICD-10-CM | POA: Diagnosis not present

## 2023-01-25 ENCOUNTER — Other Ambulatory Visit (HOSPITAL_COMMUNITY): Payer: Self-pay

## 2023-01-25 MED ORDER — ROSUVASTATIN CALCIUM 5 MG PO TABS
5.0000 mg | ORAL_TABLET | Freq: Every day | ORAL | 3 refills | Status: AC
  Filled 2023-01-25: qty 90, 90d supply, fill #0
  Filled 2023-08-01 – 2023-08-31 (×2): qty 90, 90d supply, fill #1

## 2023-01-26 ENCOUNTER — Other Ambulatory Visit (HOSPITAL_COMMUNITY): Payer: Self-pay

## 2023-01-26 DIAGNOSIS — Z1152 Encounter for screening for COVID-19: Secondary | ICD-10-CM | POA: Diagnosis not present

## 2023-01-26 DIAGNOSIS — R509 Fever, unspecified: Secondary | ICD-10-CM | POA: Diagnosis not present

## 2023-01-26 DIAGNOSIS — H6992 Unspecified Eustachian tube disorder, left ear: Secondary | ICD-10-CM | POA: Diagnosis not present

## 2023-01-26 DIAGNOSIS — J0100 Acute maxillary sinusitis, unspecified: Secondary | ICD-10-CM | POA: Diagnosis not present

## 2023-01-26 DIAGNOSIS — R5383 Other fatigue: Secondary | ICD-10-CM | POA: Diagnosis not present

## 2023-01-26 DIAGNOSIS — R059 Cough, unspecified: Secondary | ICD-10-CM | POA: Diagnosis not present

## 2023-01-26 MED ORDER — DOXYCYCLINE HYCLATE 100 MG PO TABS
100.0000 mg | ORAL_TABLET | Freq: Two times a day (BID) | ORAL | 0 refills | Status: DC
  Filled 2023-01-26: qty 20, 10d supply, fill #0

## 2023-03-16 ENCOUNTER — Ambulatory Visit (HOSPITAL_COMMUNITY)
Admission: RE | Admit: 2023-03-16 | Discharge: 2023-03-16 | Disposition: A | Discharge status: Home / Self Care | Payer: Commercial Managed Care - PPO | Source: Ambulatory Visit | Attending: Vascular Surgery | Admitting: Vascular Surgery

## 2023-03-16 ENCOUNTER — Other Ambulatory Visit (HOSPITAL_COMMUNITY): Payer: Self-pay | Admitting: Orthopedic Surgery

## 2023-03-16 DIAGNOSIS — M25562 Pain in left knee: Secondary | ICD-10-CM | POA: Insufficient documentation

## 2023-05-17 ENCOUNTER — Encounter: Payer: Self-pay | Admitting: Dermatology

## 2023-05-17 ENCOUNTER — Ambulatory Visit: Admitting: Dermatology

## 2023-05-17 DIAGNOSIS — L570 Actinic keratosis: Secondary | ICD-10-CM

## 2023-05-17 DIAGNOSIS — D1801 Hemangioma of skin and subcutaneous tissue: Secondary | ICD-10-CM

## 2023-05-17 DIAGNOSIS — Z1283 Encounter for screening for malignant neoplasm of skin: Secondary | ICD-10-CM

## 2023-05-17 DIAGNOSIS — D229 Melanocytic nevi, unspecified: Secondary | ICD-10-CM

## 2023-05-17 DIAGNOSIS — L821 Other seborrheic keratosis: Secondary | ICD-10-CM

## 2023-05-17 DIAGNOSIS — D489 Neoplasm of uncertain behavior, unspecified: Secondary | ICD-10-CM

## 2023-05-17 DIAGNOSIS — D492 Neoplasm of unspecified behavior of bone, soft tissue, and skin: Secondary | ICD-10-CM

## 2023-05-17 DIAGNOSIS — L304 Erythema intertrigo: Secondary | ICD-10-CM

## 2023-05-17 DIAGNOSIS — L814 Other melanin hyperpigmentation: Secondary | ICD-10-CM

## 2023-05-17 DIAGNOSIS — W908XXA Exposure to other nonionizing radiation, initial encounter: Secondary | ICD-10-CM

## 2023-05-17 DIAGNOSIS — D2271 Melanocytic nevi of right lower limb, including hip: Secondary | ICD-10-CM

## 2023-05-17 DIAGNOSIS — L739 Follicular disorder, unspecified: Secondary | ICD-10-CM

## 2023-05-17 DIAGNOSIS — L918 Other hypertrophic disorders of the skin: Secondary | ICD-10-CM

## 2023-05-17 DIAGNOSIS — L578 Other skin changes due to chronic exposure to nonionizing radiation: Secondary | ICD-10-CM

## 2023-05-17 NOTE — Progress Notes (Signed)
 Total Body Skin Exam (TBSE) Visit   Subjective  Shane Woods is a 58 y.o. male who presents for the following: Skin Cancer Screening and Full Body Skin Exam  Patient presents today for follow up visit for TBSE. Patient was last evaluated on 05/11/2022 . Patient denies medication changes. Patient reports he  does not have spots, moles and lesions of concern to be evaluated. Patient reports throughout his lifetime he  has had moderate sun exposure. Currently, patient reports if he  has excessive sun exposure, he  does apply sunscreen and/or wears protective coverings. Patient reports he  does not have hx of bx. Patient denies  family history of skin cancers. The patient has spots, moles and lesions to be evaluated, some may be new or changing and the patient has concerns that these could be cancer.  He has a history a Atypical Nevus on the left lower leg and Actinic Keratoses on the arms treated with LN2.    The following portions of the chart were reviewed this encounter and updated as appropriate: medications, allergies, medical history  Review of Systems:  No other skin or systemic complaints except as noted in HPI or Assessment and Plan.  Objective  Well appearing patient in no apparent distress; mood and affect are within normal limits.  A full examination was performed including scalp, head, eyes, ears, nose, lips, neck, chest, axillae, abdomen, back, buttocks, bilateral upper extremities, bilateral lower extremities, hands, feet, fingers, toes, fingernails, and toenails. All findings within normal limits unless otherwise noted below.   Relevant physical exam findings are noted in the Assessment and Plan.  Mid Back 6.5 mm brown papule   Assessment & Plan   LENTIGINES, SEBORRHEIC KERATOSES, HEMANGIOMAS - Benign normal skin lesions - Benign-appearing - Call for any changes  MELANOCYTIC NEVI - Tan-brown and/or pink-flesh-colored symmetric macules and papules - Benign appearing on  exam today - Observation - Call clinic for new or changing moles - Recommend daily use of broad spectrum spf 30+ sunscreen to sun-exposed areas.   ACTINIC DAMAGE - Chronic condition, secondary to cumulative UV/sun exposure - diffuse scaly erythematous macules with underlying dyspigmentation - Recommend daily broad spectrum sunscreen SPF 30+ to sun-exposed areas, reapply every 2 hours as needed.  - Staying in the shade or wearing long sleeves, sun glasses (UVA+UVB protection) and wide brim hats (4-inch brim around the entire circumference of the hat) are also recommended for sun protection.  - Call for new or changing lesions.  1. Actinic Keratosis - Assessment: Pre-cancerous actinic keratosis lesion on the right forearm identified during skin examination. - Plan:    Performed cryotherapy on actinic keratosis lesion on right forearm    Educate patient on sun protection measures and self-monitoring for new lesions    Follow up in one year for routine skin examination or sooner if new concerning lesions develop  2. Suspicious Nevus (NUB) - Assessment: 6.5 millimeter irregular brown papule on the patient's back, increased in size since last examination. - Plan:    Performed shave biopsy of 6.5 mm irregular brown papule on back    Sent specimen for pathological examination    Informed patient of biopsy procedure, including risks and benefits    Will communicate results to patient within approximately one week     - If benign, will send message via MyChart     - If abnormal, will call patient directly    Advised patient to monitor the biopsy site for signs of infection or  abnormal healing  3. Folliculitis - Assessment: Ongoing, intermittent folliculitis managed with as-needed use of previously prescribed clindamycin  gel. Current examination reveals clogged and inflamed hair follicles on the forehead. - Plan:    Continue clindamycin  gel for use on affected areas, particularly on forehead     Encouraged continued use of black charcoal gel once weekly    Refill clindamycin  gel prescription as needed  4. Intertrigo - Assessment: Previous intertrigo in the groin area, managed with intermittent use of a prescribed cream over the past year. No current active lesions reported or observed. - Plan:    Continue current management with as-needed use of prescribed cream for intertrigo    Educate patient on preventive measures (keeping area dry, proper hygiene)    Follow up if symptoms worsen or fail to improve with current management  5. Seborrheic Keratosis and Skin Tags - Assessment: Multiple benign lesions identified during skin examination, including seborrheic keratosis (described as a brown spot) and skin tags on the neck. - Plan:    Reassure patient about benign nature of seborrheic keratosis and skin tags    No intervention required at this time    Advise patient to report any changes or symptoms associated with these lesions  6. Melanocytic  Papule on Right Foot - Assessment: 8 mm x 4 mm brown papule on the bottom of the right foot. - Plan:    Document size and characteristics of brown papule on right foot    Educate patient on self-monitoring for changes in size, shape, or color    Re-evaluate at next annual skin examination or sooner if patient notices changes   SKIN CANCER SCREENING PERFORMED TODAY.    NEOPLASM OF UNCERTAIN BEHAVIOR Mid Back Epidermal / dermal shaving  Lesion diameter (cm):  0.7 Informed consent: discussed and consent obtained   Timeout: patient name, date of birth, surgical site, and procedure verified   Procedure prep:  Patient was prepped and draped in usual sterile fashion Prep type:  Isopropyl alcohol Anesthesia: the lesion was anesthetized in a standard fashion   Anesthetic:  1% lidocaine w/ epinephrine 1-100,000 buffered w/ 8.4% NaHCO3 Instrument used: DermaBlade and flexible razor blade   Hemostasis achieved with: pressure, aluminum  chloride and electrodesiccation   Outcome: patient tolerated procedure well   Post-procedure details: sterile dressing applied and wound care instructions given   Dressing type: bandage and petrolatum   Specimen 1 - Surgical pathology Differential Diagnosis: DN   Check Margins: No ACTINIC KERATOSIS Right Forearm - Posterior Destruction of lesion - Right Forearm - Posterior Complexity: simple   Destruction method: cryotherapy   Informed consent: discussed and consent obtained   Timeout:  patient name, date of birth, surgical site, and procedure verified Lesion destroyed using liquid nitrogen: Yes   Region frozen until ice ball extended beyond lesion: Yes   Outcome: patient tolerated procedure well with no complications   Post-procedure details: wound care instructions given   Return in about 1 year (around 05/16/2024) for TBSE F/U.  Exie Holler, CMA, am acting as scribe for [employer], DO.   Documentation: I have reviewed the above documentation for accuracy and completeness, and I agree with the above.  Louana Roup, DO

## 2023-05-17 NOTE — Patient Instructions (Signed)
Patient Handout: Wound Care for Skin Biopsy Site  Taking Care of Your Skin Biopsy Site  Proper care of the biopsy site is essential for promoting healing and minimizing scarring. This handout provides instructions on how to care for your biopsy site to ensure optimal recovery.  1. Cleaning the Wound:  Clean the biopsy site daily with gentle soap and water. Gently pat the area dry with a clean, soft towel. Avoid harsh scrubbing or rubbing the area, as this can irritate the skin and delay healing. 2. Applying Aquaphor and Bandage:  After cleaning the wound, apply a thin layer of Aquaphor ointment to the biopsy site. Cover the area with a sterile bandage to protect it from dirt, bacteria, and friction. Change the bandage daily or as needed if it becomes soiled or wet. 3. Continued Care for One Week:  Repeat the cleaning, Aquaphor application, and bandaging process daily for one week following the biopsy procedure. Keeping the wound clean and moist during this initial healing period will help prevent infection and promote optimal healing. 4. Massaging Aquaphor into the Area:  After one week, discontinue the use of bandages but continue to apply Aquaphor to the biopsy site. Gently massage the Aquaphor into the area using circular motions. Massaging the skin helps to promote circulation and prevent the formation of scar tissue. Additional Tips:  Avoid exposing the biopsy site to direct sunlight during the healing process, as this can cause hyperpigmentation or worsen scarring. If you experience any signs of infection, such as increased redness, swelling, warmth, or drainage from the wound, contact your healthcare provider immediately. Follow any additional instructions provided by your healthcare provider for caring for the biopsy site and managing any discomfort. Conclusion:  Taking proper care of your skin biopsy site is crucial for ensuring optimal healing and minimizing scarring. By  following these instructions for cleaning, applying Aquaphor, and massaging the area, you can promote a smooth and successful recovery. If you have any questions or concerns about caring for your biopsy site, don't hesitate to contact your healthcare provider for guidance.  

## 2023-05-21 ENCOUNTER — Other Ambulatory Visit (HOSPITAL_COMMUNITY): Payer: Self-pay

## 2023-05-21 LAB — SURGICAL PATHOLOGY

## 2023-05-21 MED ORDER — DOXYCYCLINE HYCLATE 100 MG PO TABS
100.0000 mg | ORAL_TABLET | Freq: Two times a day (BID) | ORAL | 0 refills | Status: DC
  Filled 2023-05-21: qty 14, 7d supply, fill #0

## 2023-05-21 MED ORDER — FLUTICASONE PROPIONATE 50 MCG/ACT NA SUSP
1.0000 | Freq: Every day | NASAL | 0 refills | Status: AC
  Filled 2023-05-21: qty 16, 60d supply, fill #0

## 2023-05-27 DIAGNOSIS — E785 Hyperlipidemia, unspecified: Secondary | ICD-10-CM | POA: Diagnosis not present

## 2023-05-27 DIAGNOSIS — Z125 Encounter for screening for malignant neoplasm of prostate: Secondary | ICD-10-CM | POA: Diagnosis not present

## 2023-05-27 DIAGNOSIS — I2510 Atherosclerotic heart disease of native coronary artery without angina pectoris: Secondary | ICD-10-CM | POA: Diagnosis not present

## 2023-05-30 ENCOUNTER — Encounter: Payer: Self-pay | Admitting: Dermatology

## 2023-05-31 ENCOUNTER — Encounter: Payer: Self-pay | Admitting: Dermatology

## 2023-07-21 DIAGNOSIS — J309 Allergic rhinitis, unspecified: Secondary | ICD-10-CM | POA: Diagnosis not present

## 2023-07-21 DIAGNOSIS — R0981 Nasal congestion: Secondary | ICD-10-CM | POA: Diagnosis not present

## 2023-08-01 ENCOUNTER — Encounter: Payer: Self-pay | Admitting: Dermatology

## 2023-08-02 NOTE — Telephone Encounter (Signed)
 Hi Shirron,  Please let pt know that I cannot refill another doctor's prescription.  The image appears to be a contact dermatitis.  I recommend he continue the topical neadryl and HCT 2.5% BID for 2-3 weeks.   -Dr. Davod

## 2023-08-16 ENCOUNTER — Other Ambulatory Visit (HOSPITAL_COMMUNITY): Payer: Self-pay

## 2023-08-31 ENCOUNTER — Other Ambulatory Visit (HOSPITAL_COMMUNITY): Payer: Self-pay

## 2023-12-20 ENCOUNTER — Other Ambulatory Visit (HOSPITAL_COMMUNITY): Payer: Self-pay

## 2023-12-20 MED ORDER — AZITHROMYCIN 250 MG PO TABS
ORAL_TABLET | ORAL | 0 refills | Status: DC
  Filled 2023-12-20: qty 6, 5d supply, fill #0

## 2024-01-22 ENCOUNTER — Other Ambulatory Visit (HOSPITAL_COMMUNITY): Payer: Self-pay

## 2024-01-23 ENCOUNTER — Ambulatory Visit
Admission: EM | Admit: 2024-01-23 | Discharge: 2024-01-23 | Disposition: A | Discharge status: Home / Self Care | Attending: Physician Assistant | Admitting: Physician Assistant

## 2024-01-23 ENCOUNTER — Other Ambulatory Visit: Payer: Self-pay

## 2024-01-23 DIAGNOSIS — U071 COVID-19: Secondary | ICD-10-CM | POA: Diagnosis not present

## 2024-01-23 LAB — POCT INFLUENZA A/B
Influenza A, POC: NEGATIVE
Influenza B, POC: NEGATIVE

## 2024-01-23 LAB — POC SOFIA SARS ANTIGEN FIA: SARS Coronavirus 2 Ag: POSITIVE — AB

## 2024-01-23 MED ORDER — PAXLOVID (300/100) 20 X 150 MG & 10 X 100MG PO TBPK: 3.0000 | ORAL_TABLET | Freq: Two times a day (BID) | ORAL | 0 refills | Status: AC

## 2024-01-23 NOTE — ED Triage Notes (Signed)
 Pt presents with c/o cough, nasal congestion, headaches, fatigue, and sweats. Symptoms began yesterday morning. OTC Alka-Seltzer taken for symptoms with improvement. States his drainage is yellow. Currently rates overall pain a 4/10. Denies sick contacts.

## 2024-01-23 NOTE — Discharge Instructions (Addendum)
 You were seen today for concerns of runny nose, coughing, fatigue.  Your testing was positive for COVID-19. You are currently on day 2  of symptoms.  This means that you are within the appropriate window to start an antiviral medication called Paxlovid .  I have sent this in to the pharmacy that we have on file. Please note that Paxlovid  can cause nausea, vomiting, has been reported to have a bad taste.  It can also interact with multiple medications.  You are currently taking a medication called rosuvastatin  . I recommend that you stop taking this for 10 days as this interacts with the Paxlovid .  You can resume it on 02/02/24  In addition to the antiviral medication you can take over-the-counter medications such as DayQuil/NyQuil Alka-Seltzer day and night TheraFlu day and night  If you have High blood pressure I recommend taking Mucinex, Robitussin, and Acetaminophen  rather than the combination medications. Flonase  nasal spray Nasal saline flushes/rinses Humidifier at night Warm tea with honey Ibuprofen as needed  Please make sure that you are getting plenty of rest and staying well-hydrated If you develop any of the following symptoms please go to the emergency room: Significant difficulty breathing, chest pain, fevers that are not responding to acetaminophen  or ibuprofen, swelling of one of your arms or legs, inability to eat or drink leading to dehydration, confusion, loss of consciousness.

## 2024-01-23 NOTE — ED Provider Notes (Signed)
 " [facility]    CSN: [id number] Arrival date & time: 01/23/24  1524      History   Chief Complaint Chief Complaint  Patient presents with   Cough   Nasal Congestion   Headache    HPI Shane Woods is a 58 y.o. male.   HPI  Pt is here today with concerns of coughing, nasal congestion, headaches, fatigue, and sweats that started yesterday AM. He states this AM he started to have more runny nose and had some intermittent flushing He denies SOB, wheezing, n/v/d, body aches He denies recent sick contacts or travel Interventions: Alkaseltzer cold and flu   He denies previous hx of asthma or COPD  Reviewed pt's labs from his PCP office - [facility] from 05/27/23 on his cell phone, eGFR is 76.7   Past Medical History:  Diagnosis Date   Actinic keratosis    Atypical mole    GERD (gastroesophageal reflux disease)    Hernia of abdominal wall     Patient Active Problem List   Diagnosis Date Noted   Epiphora due to insufficient drainage 05/20/2012    History reviewed. No pertinent surgical history.     Home Medications    Prior to Admission medications  Medication Sig Start Date End Date Taking? Authorizing Provider  nirmatrelvir /ritonavir  (PAXLOVID , 300/100,) 20 x 150 MG & 10 x 100MG  TBPK Take 3 tablets by mouth 2 (two) times daily for 5 days. Patient GFR is 76.7. Take nirmatrelvir  (150 mg) two tablets twice daily for 5 days and ritonavir  (100 mg) one tablet twice daily for 5 days. 01/23/24 01/28/24 Yes Chantrice Hagg E, PA-C  Blood Pressure Monitoring (OMRON 3 SERIES BP MONITOR) DEVI Use as directed 06/20/21     fluticasone  (FLONASE  ALLERGY RELIEF) 50 MCG/ACT nasal spray Place 1 spray in each nostril Once a day 05/07/21     fluticasone  (FLONASE ) 50 MCG/ACT nasal spray Place 1 spray into both nostrils daily. 05/21/23     nystatin -triamcinolone  ointment (MYCOLOG) Apply to affected area on buttock twice daily as needed for flares 05/11/22   Alm Delon SAILOR, DO   polyethylene glycol-electrolytes (NULYTELY) 420 g solution Use as directed. 03/21/21   Burnette Fallow, MD  rosuvastatin  (CRESTOR ) 10 MG tablet Take 1 tablet (10 mg total) by mouth daily. 02/26/22   Hobart Powell BRAVO, MD  rosuvastatin  (CRESTOR ) 5 MG tablet Take 1 tablet (5 mg total) by mouth daily. 01/25/23       Family History Family History  Problem Relation Age of Onset   Heart attack Father     Social History Social History[1]   Allergies   Penicillins   Review of Systems Review of Systems  Constitutional:  Positive for diaphoresis, fatigue and fever (subjective).  HENT:  Positive for congestion and rhinorrhea. Negative for ear pain and sore throat.   Respiratory:  Positive for cough. Negative for shortness of breath and wheezing.   Gastrointestinal:  Negative for diarrhea, nausea and vomiting.  Musculoskeletal:  Negative for myalgias.     Physical Exam Triage Vital Signs ED Triage Vitals  Encounter Vitals Group     BP 01/23/24 1551 112/74     Girls Systolic BP Percentile --      Girls Diastolic BP Percentile --      Boys Systolic BP Percentile --      Boys Diastolic BP Percentile --      Pulse Rate 01/23/24 1551 80     Resp 01/23/24 1551 18  Temp 01/23/24 1551 99 F (37.2 C)     Temp Source 01/23/24 1551 Oral     SpO2 01/23/24 1551 95 %     Weight 01/23/24 1549 191 lb (86.6 kg)     Height 01/23/24 1549 5' 7 (1.702 m)     Head Circumference --      Peak Flow --      Pain Score 01/23/24 1549 4     Pain Loc --      Pain Education --      Exclude from Growth Chart --    No data found.  Updated Vital Signs BP 112/74 (BP Location: Right Arm)   Pulse 80   Temp 99 F (37.2 C) (Oral)   Resp 18   Ht 5' 7 (1.702 m)   Wt 191 lb (86.6 kg)   SpO2 95%   BMI 29.91 kg/m   Visual Acuity Right Eye Distance:   Left Eye Distance:   Bilateral Distance:    Right Eye Near:   Left Eye Near:    Bilateral Near:     Physical Exam Vitals reviewed.   Constitutional:      General: He is awake. He is not in acute distress.    Appearance: Normal appearance. He is well-developed and well-groomed. He is not ill-appearing, toxic-appearing or diaphoretic.  HENT:     Head: Normocephalic and atraumatic.     Right Ear: Hearing, tympanic membrane and ear canal normal.     Left Ear: Hearing, tympanic membrane and ear canal normal.     Mouth/Throat:     Lips: Pink.     Mouth: Mucous membranes are moist.     Pharynx: Oropharynx is clear. Uvula midline. No pharyngeal swelling, oropharyngeal exudate, posterior oropharyngeal erythema, uvula swelling or postnasal drip.     Tonsils: No tonsillar exudate or tonsillar abscesses.  Eyes:     General: Lids are normal. Gaze aligned appropriately.     Extraocular Movements: Extraocular movements intact.  Cardiovascular:     Rate and Rhythm: Normal rate and regular rhythm.     Heart sounds: Normal heart sounds.  Pulmonary:     Effort: Pulmonary effort is normal.     Breath sounds: Normal breath sounds. No decreased air movement. No decreased breath sounds, wheezing, rhonchi or rales.  Musculoskeletal:     Cervical back: Normal range of motion and neck supple.  Lymphadenopathy:     Head:     Right side of head: No submental, submandibular or preauricular adenopathy.     Left side of head: No submental, submandibular or preauricular adenopathy.     Cervical:     Right cervical: No superficial cervical adenopathy.    Left cervical: No superficial cervical adenopathy.     Upper Body:     Right upper body: No supraclavicular adenopathy.     Left upper body: No supraclavicular adenopathy.  Skin:    General: Skin is warm and dry.  Neurological:     General: No focal deficit present.     Mental Status: He is alert and oriented to person, place, and time.  Psychiatric:        Mood and Affect: Mood normal.        Behavior: Behavior normal. Behavior is cooperative.        Thought Content: Thought content  normal.        Judgment: Judgment normal.      UC Treatments / Results  Labs (all labs ordered are listed, but  only abnormal results are displayed) Labs Reviewed  POC SOFIA SARS ANTIGEN FIA - Abnormal; Notable for the following components:      Result Value   SARS Coronavirus 2 Ag Positive (*)    All other components within normal limits  POCT INFLUENZA A/B    EKG   Radiology No results found.  Procedures Procedures (including critical care time)  Medications Ordered in UC Medications - No data to display  Initial Impression / Assessment and Plan / UC Course  I have reviewed the triage vital signs and the nursing notes.  Pertinent labs & imaging results that were available during my care of the patient were reviewed by me and considered in my medical decision making (see chart for details).      Final Clinical Impressions(s) / UC Diagnoses   Final diagnoses:  COVID-19    Patient is here today with concerns of dry coughing, nasal congestion, headaches and fatigue that started yesterday morning.  He has been taking Alka-Seltzer cold and flu and reports some mild relief with this.  Physical exam and vitals are largely reassuring at this time.  Rapid testing was positive for COVID.  Test results reviewed with patient during appointment.  Since symptoms started yesterday he is still within the window for antiviral medications.  I reviewed his current medication list and reviewed that he should not take Paxlovid  with rosuvastatin  and reviewed appropriate pause window for rosuvastatin  while taking Paxlovid .  Reviewed that patient can continue to take OTC medications for further symptomatic relief while on antiviral medication.  I reviewed his most recent lab results from 05/27/2023 from his PCP office, eGFR is 76.7 per those results which is appropriate for normal Paxlovid  dosing.  ED and return precautions reviewed and provided in AVS.  Follow-up as needed.    Discharge  Instructions      You were seen today for concerns of runny nose, coughing, fatigue.  Your testing was positive for COVID-19. You are currently on day 2  of symptoms.  This means that you are within the appropriate window to start an antiviral medication called Paxlovid .  I have sent this in to the pharmacy that we have on file. Please note that Paxlovid  can cause nausea, vomiting, has been reported to have a bad taste.  It can also interact with multiple medications.  You are currently taking a medication called rosuvastatin  . I recommend that you stop taking this for 10 days as this interacts with the Paxlovid .  You can resume it on 02/02/24  In addition to the antiviral medication you can take over-the-counter medications such as DayQuil/NyQuil Alka-Seltzer day and night TheraFlu day and night  If you have High blood pressure I recommend taking Mucinex, Robitussin, and Acetaminophen  rather than the combination medications. Flonase  nasal spray Nasal saline flushes/rinses Humidifier at night Warm tea with honey Ibuprofen as needed  Please make sure that you are getting plenty of rest and staying well-hydrated If you develop any of the following symptoms please go to the emergency room: Significant difficulty breathing, chest pain, fevers that are not responding to acetaminophen  or ibuprofen, swelling of one of your arms or legs, inability to eat or drink leading to dehydration, confusion, loss of consciousness.      ED Prescriptions     Medication Sig Dispense Auth. Provider   nirmatrelvir /ritonavir  (PAXLOVID , 300/100,) 20 x 150 MG & 10 x 100MG  TBPK Take 3 tablets by mouth 2 (two) times daily for 5 days. Patient GFR is 76.7.  Take nirmatrelvir  (150 mg) two tablets twice daily for 5 days and ritonavir  (100 mg) one tablet twice daily for 5 days. 30 tablet Dysen Edmondson E, PA-C      PDMP not reviewed this encounter.     [1]  Social History Tobacco Use   Smoking status: Never    Smokeless tobacco: Never  Vaping Use   Vaping status: Never Used  Substance Use Topics   Alcohol use: Never    Comment: occasional   Drug use: No     Aniston Christman, Rocky BRAVO, PA-C 01/23/24 1641  "

## 2024-05-16 ENCOUNTER — Ambulatory Visit: Admitting: Dermatology
